# Patient Record
Sex: Female | Born: 1970 | Race: White | Hispanic: No | Marital: Single | Smoking: Never smoker
Health system: Southern US, Community
[De-identification: ages and names within clinical notes are randomized; demographics above are authoritative.]

## PROBLEM LIST (undated history)

## (undated) ENCOUNTER — Ambulatory Visit (HOSPITAL_COMMUNITY): Admission: EM | Payer: BC Managed Care – PPO

## (undated) DIAGNOSIS — I471 Supraventricular tachycardia, unspecified: Secondary | ICD-10-CM

## (undated) DIAGNOSIS — O24419 Gestational diabetes mellitus in pregnancy, unspecified control: Secondary | ICD-10-CM

## (undated) DIAGNOSIS — N200 Calculus of kidney: Secondary | ICD-10-CM

## (undated) DIAGNOSIS — R0602 Shortness of breath: Secondary | ICD-10-CM

## (undated) DIAGNOSIS — G43009 Migraine without aura, not intractable, without status migrainosus: Secondary | ICD-10-CM

## (undated) HISTORY — PX: WISDOM TOOTH EXTRACTION: SHX21

## (undated) HISTORY — DX: Supraventricular tachycardia, unspecified: I47.10

## (undated) HISTORY — DX: Supraventricular tachycardia: I47.1

## (undated) HISTORY — DX: Migraine without aura, not intractable, without status migrainosus: G43.009

---

## 1998-02-24 ENCOUNTER — Other Ambulatory Visit: Admission: RE | Admit: 1998-02-24 | Discharge: 1998-02-24 | Payer: Self-pay | Admitting: Obstetrics and Gynecology

## 1999-03-08 ENCOUNTER — Other Ambulatory Visit: Admission: RE | Admit: 1999-03-08 | Discharge: 1999-03-08 | Payer: Self-pay | Admitting: Obstetrics and Gynecology

## 2000-03-09 ENCOUNTER — Other Ambulatory Visit: Admission: RE | Admit: 2000-03-09 | Discharge: 2000-03-09 | Payer: Self-pay | Admitting: Obstetrics and Gynecology

## 2000-09-06 ENCOUNTER — Encounter (INDEPENDENT_AMBULATORY_CARE_PROVIDER_SITE_OTHER): Payer: Self-pay

## 2000-09-06 ENCOUNTER — Inpatient Hospital Stay (HOSPITAL_COMMUNITY): Admission: AD | Admit: 2000-09-06 | Discharge: 2000-09-06 | Payer: Self-pay | Admitting: Obstetrics and Gynecology

## 2000-09-06 ENCOUNTER — Encounter: Payer: Self-pay | Admitting: Obstetrics and Gynecology

## 2000-11-21 ENCOUNTER — Encounter: Admission: RE | Admit: 2000-11-21 | Discharge: 2000-11-21 | Payer: Self-pay | Admitting: Obstetrics & Gynecology

## 2001-02-11 ENCOUNTER — Encounter (INDEPENDENT_AMBULATORY_CARE_PROVIDER_SITE_OTHER): Payer: Self-pay | Admitting: Specialist

## 2001-02-11 ENCOUNTER — Inpatient Hospital Stay (HOSPITAL_COMMUNITY): Admission: AD | Admit: 2001-02-11 | Discharge: 2001-02-17 | Payer: Self-pay | Admitting: Obstetrics & Gynecology

## 2001-02-18 ENCOUNTER — Encounter: Admission: RE | Admit: 2001-02-18 | Discharge: 2001-03-20 | Payer: Self-pay | Admitting: Obstetrics and Gynecology

## 2001-03-12 ENCOUNTER — Other Ambulatory Visit: Admission: RE | Admit: 2001-03-12 | Discharge: 2001-03-12 | Payer: Self-pay | Admitting: Obstetrics & Gynecology

## 2001-03-21 ENCOUNTER — Encounter: Admission: RE | Admit: 2001-03-21 | Discharge: 2001-04-20 | Payer: Self-pay | Admitting: Obstetrics and Gynecology

## 2001-04-21 ENCOUNTER — Encounter: Admission: RE | Admit: 2001-04-21 | Discharge: 2001-05-21 | Payer: Self-pay | Admitting: Obstetrics and Gynecology

## 2003-01-21 ENCOUNTER — Other Ambulatory Visit: Admission: RE | Admit: 2003-01-21 | Discharge: 2003-01-21 | Payer: Self-pay | Admitting: Obstetrics and Gynecology

## 2004-11-16 ENCOUNTER — Other Ambulatory Visit: Admission: RE | Admit: 2004-11-16 | Discharge: 2004-11-16 | Payer: Self-pay | Admitting: Obstetrics and Gynecology

## 2005-12-29 ENCOUNTER — Other Ambulatory Visit: Admission: RE | Admit: 2005-12-29 | Discharge: 2005-12-29 | Payer: Self-pay | Admitting: Obstetrics and Gynecology

## 2006-03-20 ENCOUNTER — Ambulatory Visit (HOSPITAL_COMMUNITY): Admission: RE | Admit: 2006-03-20 | Discharge: 2006-03-20 | Payer: Self-pay | Admitting: Obstetrics and Gynecology

## 2006-09-13 ENCOUNTER — Emergency Department (HOSPITAL_COMMUNITY): Admission: EM | Admit: 2006-09-13 | Discharge: 2006-09-13 | Payer: Self-pay | Admitting: *Deleted

## 2006-10-17 ENCOUNTER — Ambulatory Visit: Payer: Self-pay | Admitting: Cardiovascular Disease

## 2006-10-28 ENCOUNTER — Ambulatory Visit: Payer: Self-pay | Admitting: Cardiovascular Disease

## 2006-11-06 ENCOUNTER — Ambulatory Visit: Payer: Self-pay | Admitting: Cardiovascular Disease

## 2006-11-06 ENCOUNTER — Encounter: Payer: Self-pay | Admitting: Cardiovascular Disease

## 2006-11-06 ENCOUNTER — Ambulatory Visit: Payer: Self-pay

## 2006-11-06 LAB — CONVERTED CEMR LAB
Cholesterol: 183 mg/dL (ref 0–200)
Free T4: 0.7 ng/dL (ref 0.6–1.6)
HDL: 44.7 mg/dL (ref >39.0)
LDL Cholesterol: 122 mg/dL — ABNORMAL HIGH (ref 0–99)
TSH: 2.51 microintl units/mL (ref 0.35–5.50)
Total CHOL/HDL Ratio: 4.1
Triglycerides: 82 mg/dL (ref 0–149)
VLDL: 16 mg/dL (ref 0–40)

## 2006-11-09 ENCOUNTER — Ambulatory Visit: Payer: Self-pay | Admitting: Cardiovascular Disease

## 2006-12-20 ENCOUNTER — Ambulatory Visit: Payer: Self-pay | Admitting: Internal Medicine

## 2007-02-27 ENCOUNTER — Emergency Department (HOSPITAL_COMMUNITY): Admission: EM | Admit: 2007-02-27 | Discharge: 2007-02-27 | Payer: Self-pay | Admitting: Emergency Medicine

## 2007-03-22 ENCOUNTER — Encounter: Admission: RE | Admit: 2007-03-22 | Discharge: 2007-03-22 | Payer: Self-pay | Admitting: Obstetrics and Gynecology

## 2007-08-01 ENCOUNTER — Other Ambulatory Visit: Payer: Self-pay | Admitting: Obstetrics and Gynecology

## 2007-08-03 ENCOUNTER — Inpatient Hospital Stay (HOSPITAL_COMMUNITY): Admission: AD | Admit: 2007-08-03 | Discharge: 2007-08-07 | Payer: Self-pay | Admitting: Obstetrics and Gynecology

## 2007-08-08 ENCOUNTER — Inpatient Hospital Stay (HOSPITAL_COMMUNITY): Admission: AD | Admit: 2007-08-08 | Discharge: 2007-08-08 | Payer: Self-pay | Admitting: Obstetrics and Gynecology

## 2008-08-07 ENCOUNTER — Emergency Department (HOSPITAL_COMMUNITY): Admission: EM | Admit: 2008-08-07 | Discharge: 2008-08-07 | Payer: Self-pay | Admitting: Emergency Medicine

## 2008-08-07 IMAGING — CT CT PELVIS W/O CM
2 of 6 series · 14 of 36 positions shown, 19 images · IV contrast (agent unspecified)
Comparison: None

CT ABDOMEN:

CLINICAL DATA: Mid to lower abdominal pain, nausea, syncope,
history stones

CT ABDOMEN AND PELVIS WITHOUT CONTRAST:
TECHNIQUE: Multidetector helical CT imaging of the abdomen and
pelvis were performed usuing kidney stone protocol.  Neither oral
nor intravenous contrast utlized for this indication.  Sagittal and
coronal MPR images reconstructed from the axial data set.

[Series 102: >200 lbs stone · axial · 0.78mm/px · z∈[-410,-2]mm · 13 of 409 slices shown, 17 images]
[im 21/409  soft-tissue]
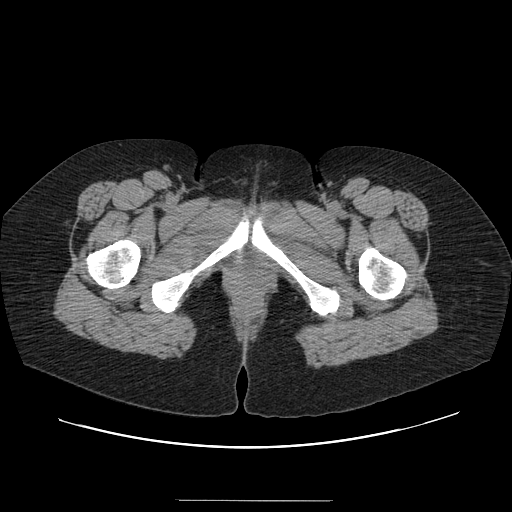
[im 21/409  bone]
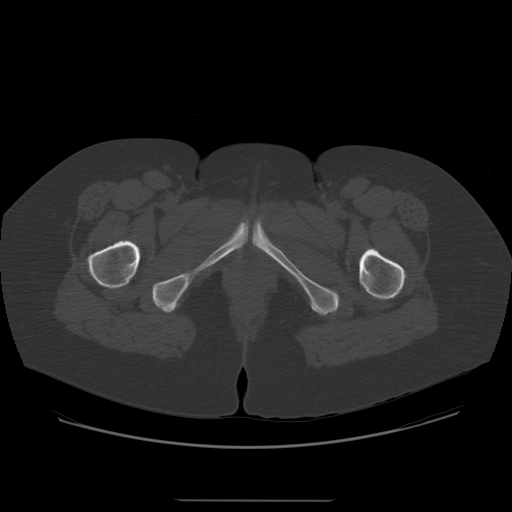
[im 62/409  soft-tissue]
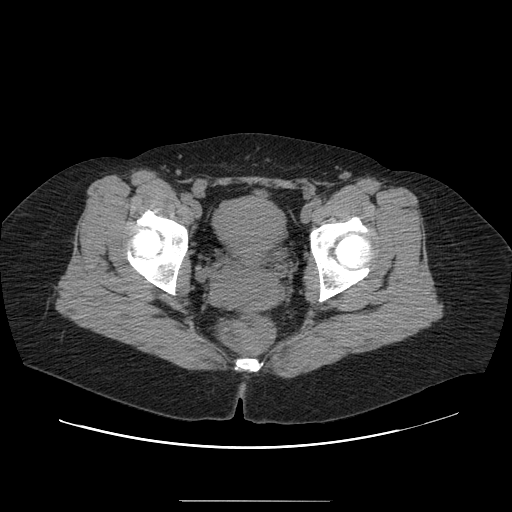
[im 103/409  soft-tissue]
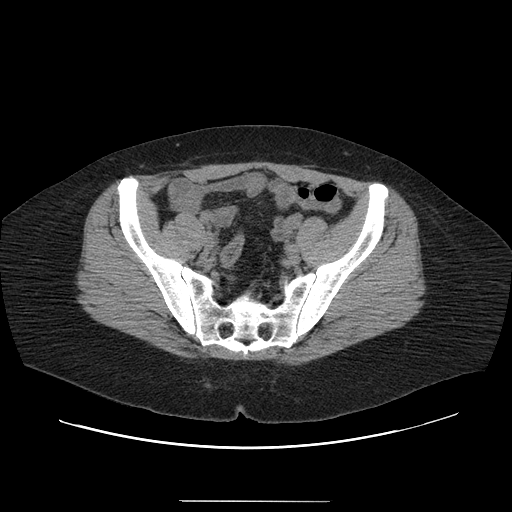
[im 143/409  soft-tissue]
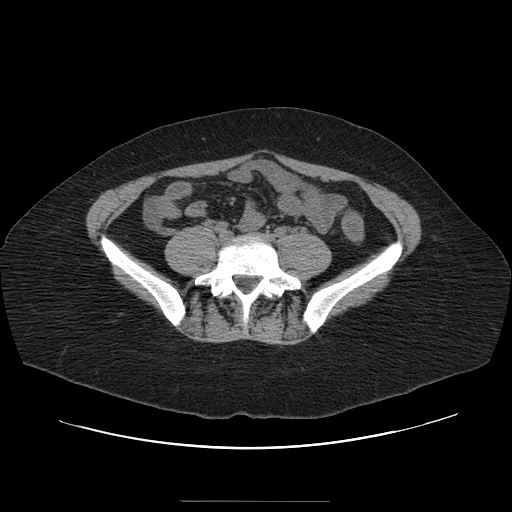
[im 164/409  soft-tissue]
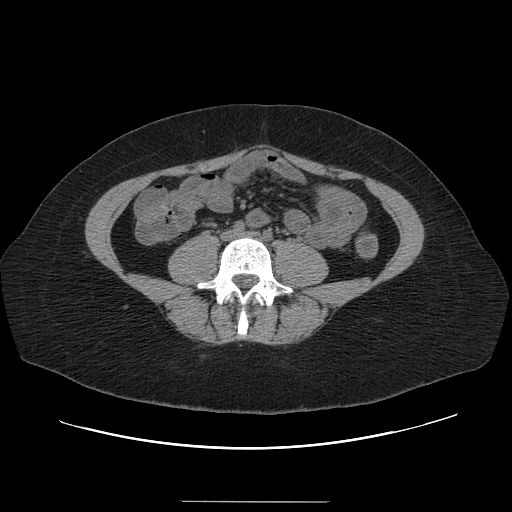
[im 205/409  soft-tissue]
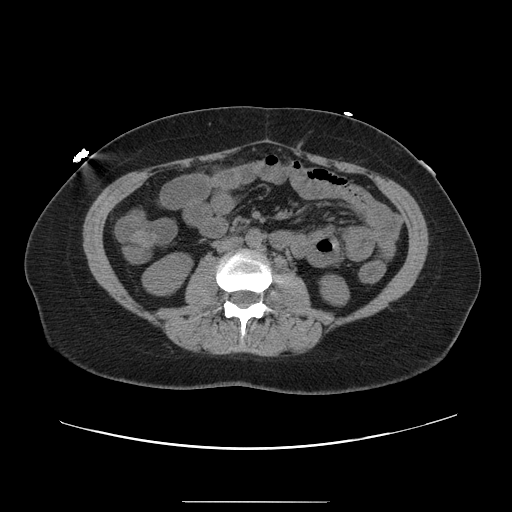
[im 245/409  soft-tissue]
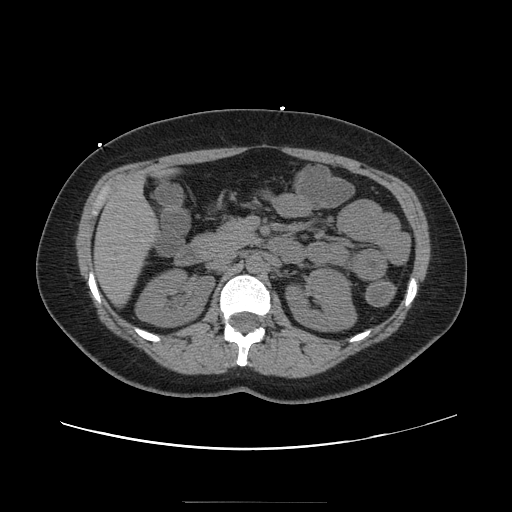
[im 266/409  soft-tissue]
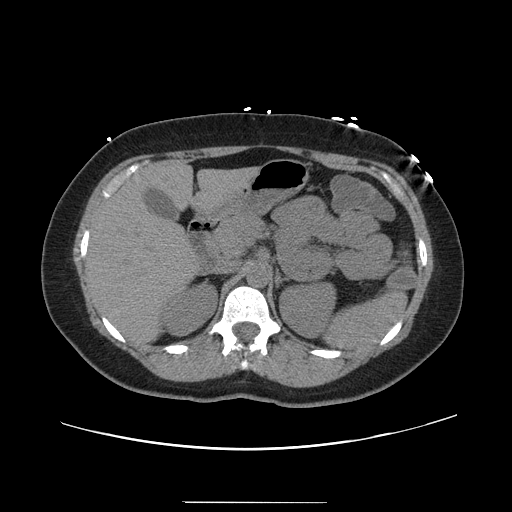
[im 307/409  soft-tissue]
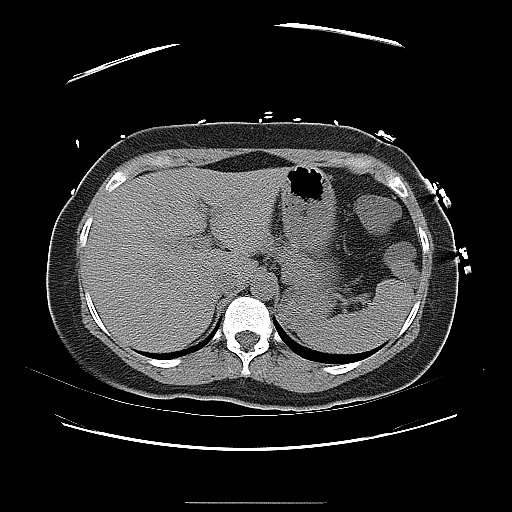
[im 307/409  bone]
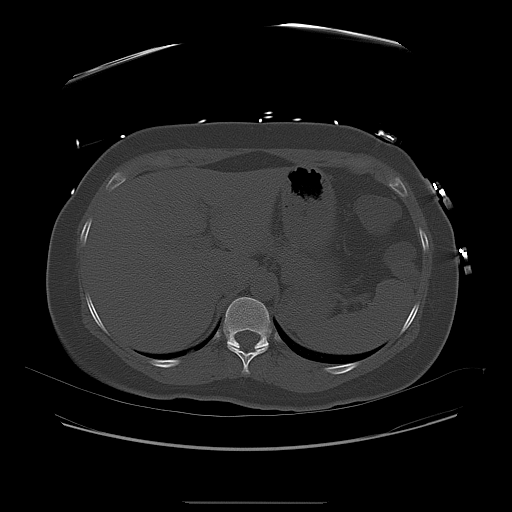
[im 327/409  lung]
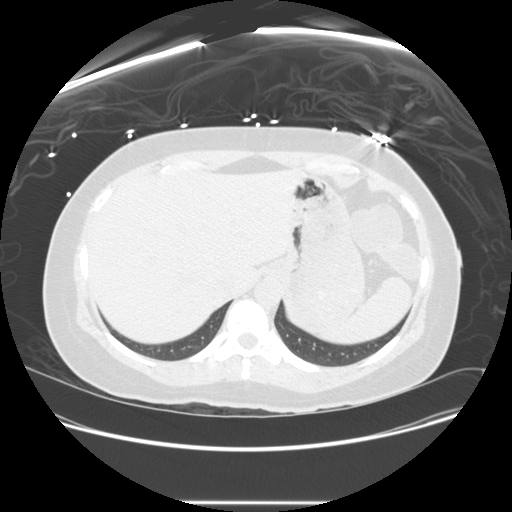
[im 347/409  soft-tissue]
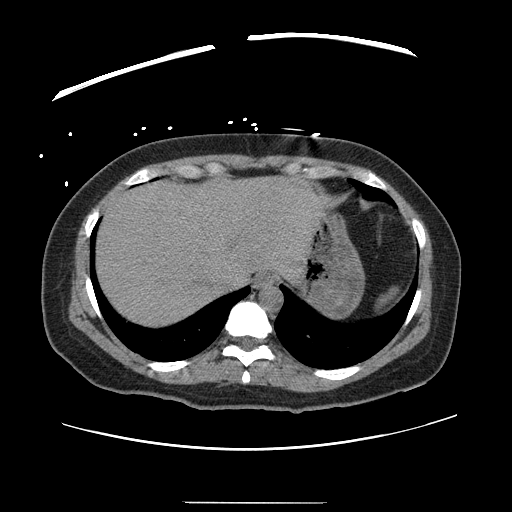
[im 347/409  lung]
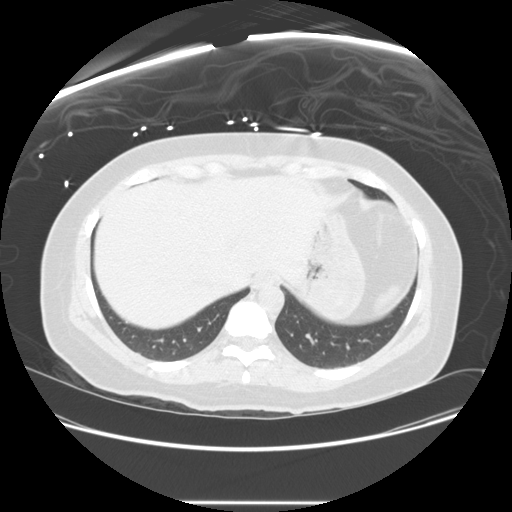
[im 368/409  lung]
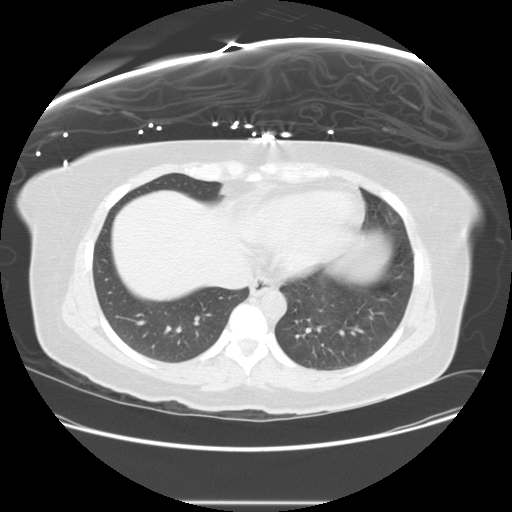
[im 388/409  soft-tissue]
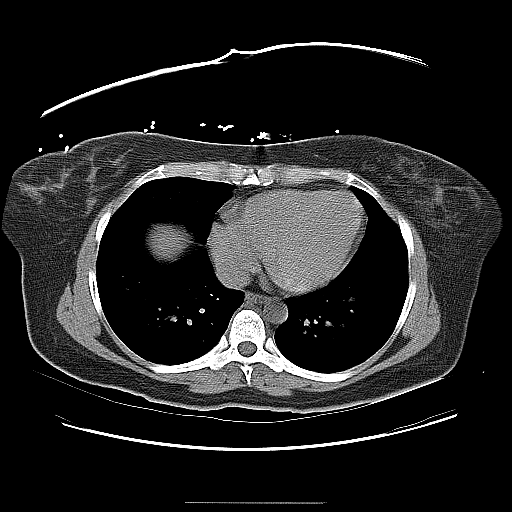
[im 388/409  lung]
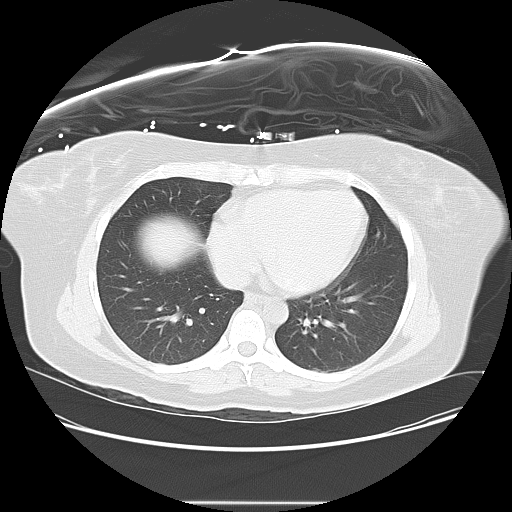

[Series 103: reformatted · sagittal · 0.91mm/px · 1 of 121 slices shown, 2 images]
[im 41/121  soft-tissue]
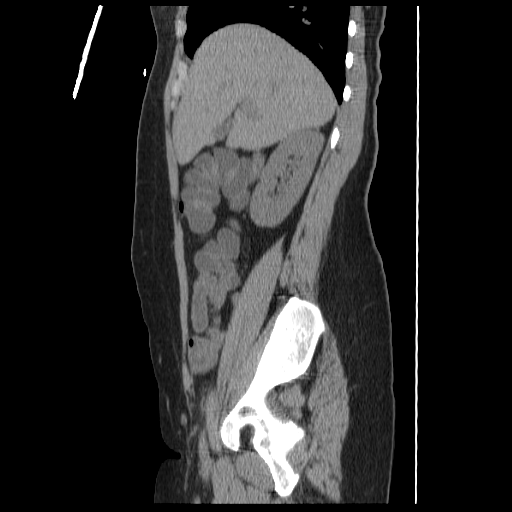
[im 41/121  bone]
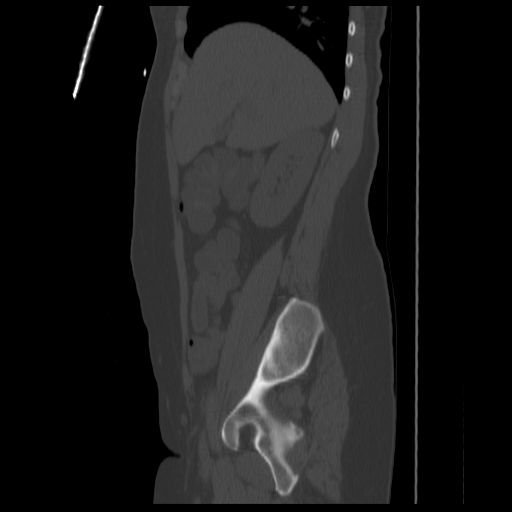

[14 of 36 positions shown; findings below may reference images not displayed]

FINDINGS: Lung bases clear.
Tiny nonobstructing calculus lower pole right kidney, 3 mm diameter
image 33.
AMAMBAL hydronephrosis, ureteral calcification, or ureteral dilatation.
Tiny 102 mm calculus lower pole left kidney 36.
Solid organs and bowel loops in upper abdomen unremarkable for exam
lacking IV and oral contrast.
No mass, adenopathy, or free fluid.
IMPRESSION: Tiny bilateral nonobstructing calculi lower poles of both kidneys.
No acute upper abdominal abnormalities.

CT PELVIS:
FINDINGS: Normal appendix.
Large and small bowel loops in pelvis grossly normal appearance for
exam lacking IV and oral contrast.
Unremarkable uterus and adnexae.
Bones unremarkable.
Single tiny calcification posterior to bladder on right, image 79,
difficult to localize distal right ureter on axial images but
possibility of nonobstructing distal right ureteral calculus
raised.
No ureteral dilatation.
Bladder decompressed.
IMPRESSION: Tiny 1 to 2 mm diameter calcification posterior right bladder base,
difficult to visualize the distal right ureter with possibility of
a nonobstructing distal right ureteral calculus is raised,
recommend correlation with urinalysis.
No other significant intrapelvic abnormalities.

## 2009-04-20 ENCOUNTER — Telehealth (INDEPENDENT_AMBULATORY_CARE_PROVIDER_SITE_OTHER): Payer: Self-pay | Admitting: *Deleted

## 2009-08-01 HISTORY — PX: INGUINAL HERNIA REPAIR: SUR1180

## 2009-10-16 ENCOUNTER — Telehealth: Payer: Self-pay | Admitting: Cardiovascular Disease

## 2009-10-26 ENCOUNTER — Ambulatory Visit (HOSPITAL_BASED_OUTPATIENT_CLINIC_OR_DEPARTMENT_OTHER): Admission: RE | Admit: 2009-10-26 | Discharge: 2009-10-26 | Payer: Self-pay | Admitting: General Surgery

## 2010-09-02 NOTE — Progress Notes (Signed)
Summary: can pt be release   Phone Note Call from Patient Call back at Home Phone [phone] Call back at [phone]   Caller: Patient- teacher leave message Reason for Call: Talk to Nurse Details for Reason: can pt be release from care, pt haven't had no problem. - insurance reason. Initial call taken by: Lorne Skeens,  October 16, 2009 11:29 AM  Follow-up for Phone Call        left message for pt to call with any problems in the future Deliah Goody, RN  October 16, 2009 12:15 PM

## 2010-10-25 LAB — CBC
HCT: 38 % (ref 36.0–46.0)
MCHC: 34.1 g/dL (ref 30.0–36.0)
MCV: 90.7 fL (ref 78.0–100.0)
Platelets: 205 10*3/uL (ref 150–400)
RDW: 13.1 % (ref 11.5–15.5)
WBC: 5.4 10*3/uL (ref 4.0–10.5)

## 2010-10-25 LAB — BASIC METABOLIC PANEL
BUN: 11 mg/dL (ref 6–23)
Chloride: 108 mEq/L (ref 96–112)
Creatinine, Ser: 0.64 mg/dL (ref 0.4–1.2)
Glucose, Bld: 88 mg/dL (ref 70–99)
Potassium: 4.2 mEq/L (ref 3.5–5.1)

## 2010-10-25 LAB — DIFFERENTIAL
Basophils Absolute: 0 10*3/uL (ref 0.0–0.1)
Basophils Relative: 1 % (ref 0–1)
Eosinophils Absolute: 0.1 10*3/uL (ref 0.0–0.7)
Eosinophils Relative: 2 % (ref 0–5)
Lymphs Abs: 2 10*3/uL (ref 0.7–4.0)
Neutrophils Relative %: 54 % (ref 43–77)

## 2010-10-25 LAB — PREGNANCY, URINE: Preg Test, Ur: NEGATIVE

## 2010-11-15 LAB — CBC
Hemoglobin: 13.8 g/dL (ref 12.0–15.0)
MCHC: 33.3 g/dL (ref 30.0–36.0)
RBC: 4.64 MIL/uL (ref 3.87–5.11)
WBC: 17.3 10*3/uL — ABNORMAL HIGH (ref 4.0–10.5)

## 2010-11-15 LAB — BASIC METABOLIC PANEL
CO2: 24 mEq/L (ref 19–32)
Calcium: 9.2 mg/dL (ref 8.4–10.5)
Creatinine, Ser: 0.73 mg/dL (ref 0.4–1.2)
GFR calc Af Amer: >60 mL/min (ref >60)
GFR calc non Af Amer: >60 mL/min (ref >60)
Sodium: 140 mEq/L (ref 135–145)

## 2010-11-15 LAB — DIFFERENTIAL
Basophils Relative: 1 % (ref 0–1)
Lymphocytes Relative: 9 % — ABNORMAL LOW (ref 12–46)
Monocytes Absolute: 0.9 10*3/uL (ref 0.1–1.0)
Monocytes Relative: 5 % (ref 3–12)
Neutro Abs: 14.7 10*3/uL — ABNORMAL HIGH (ref 1.7–7.7)
Neutrophils Relative %: 85 % — ABNORMAL HIGH (ref 43–77)

## 2010-11-15 LAB — URINALYSIS, ROUTINE W REFLEX MICROSCOPIC
Glucose, UA: NEGATIVE mg/dL
Hgb urine dipstick: NEGATIVE
Protein, ur: NEGATIVE mg/dL
Specific Gravity, Urine: 1.026 (ref 1.005–1.030)
Urobilinogen, UA: 0.2 mg/dL (ref 0.0–1.0)

## 2010-11-15 LAB — GLUCOSE, CAPILLARY: Glucose-Capillary: 96 mg/dL (ref 70–99)

## 2010-12-14 NOTE — Discharge Summary (Signed)
Melissa, [name] NO.:  [id number]   MEDICAL RECORD NO.:  [id number]          PATIENT TYPE:  INP   LOCATION:  9109                          FACILITY:  [facility]   PHYSICIAN:  Osborn Coho, M.D.   DATE OF BIRTH:  11/12/1970   DATE OF ADMISSION:  08/03/2007  DATE OF DISCHARGE:  08/07/2007                               DISCHARGE SUMMARY   ADMISSION DIAGNOSES:  1. Intrauterine pregnancy at 39-1/7 weeks.  2. Spontaneous rupture of membranes.  3. Previous cesarean section.  4. Desires repeat cesarean section.  5. Gestational diabetes, diet controlled.  6. Gestational hypertension necessitating medications.  7. Group beta Streptococcus negative.   DISCHARGE DIAGNOSES:  1. Intrauterine pregnancy at term.  2. Breast feeding.  3. Gestational diabetes, diet controlled.  4. Gestational hypertension and normotensive postpartum.  5. Status post cesarean delivery of a viable female infant named [name],      weighing 8 pounds 12 ounces with Apgars 9/9.   PROCEDURES:  1. Spinal anesthesia.  2. Repeat low transverse cesarean section.   HOSPITAL COURSE:  [name] is a 40 year old single white female,  gravida 3, para 1-0-1-1, who presented on the date of admission  at 65-  1/7 weeks with complaint of spontaneous rupture of membranes earlier  that afternoon at approximately 1400 which was August 03, 2007.  In  maternity admissions did have positive pooling and positive fern as well  as clear leakage of fluid.  The patient was desiring a repeat cesarean  section.  She had been scheduled for a repeat cesarean section on  August 06, 2007, at noon.  Dr. Pennie Rushing was notified, and the patient was  prepped for the OR.  Repeat low transverse cesarean section was  performed per Dr. Dierdre Forth.  Findings were a viable female infant  named [name], weighing 8 pounds 12 ounces with Apgars of 9/9.  The patient  tolerated the procedure well and was taken to the recovery room in good  condition.  Placenta went to labor and delivery, and infant went to full-  term nursery.   On postoperative day #1, the patient was doing well.  She was up ad lib  and breast feeding.  Her vital signs were stable, and she was afebrile.  Her fasting blood sugar was 103, and 2 hours after breakfast, the  patient's sugar was equal to 88.  Hemoglobin was equal to 10 on  postoperative day #2.  White blood cell count was 15.1, and platelets  were stable 213.  Her abdomen was soft.  It ws appropriately tender.  Incision dressing had old drainage which was marked.  Fundus was firm.  She had scant lochia.  Extremities were within normal limits.  No  further plans were made to continue checking CBGs.  The patient's blood  pressure was normotensive.   On postoperative day #2, the patient continued to do well.  She was  voiding without difficulty, tolerating a regular diet,was up ad lib.  Her pain was well controlled with Motrin and p.r.n. Percocet.  The  patient did report difficulty with lactation secondary to  engorgement  and was encouraged to pump prior to assisting newborn female to breast.  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  The patient's  vital signs remained stable, and the patient remained afebrile.  Physical exam remained within normal limits.  Breasts were full but  soft, slightly tender.  No redness or blisters.  Fundus remained firm -  2.  She had scant lochia and had negative Homan's sign, and extremities  within normal limits.  The patient worked with Advertising copywriter that  day using SNF as well as syringe feed and continued to pump, massage,  with warm compresses while pumping to help with engorgement.  She  remained normotensive.   Remainder of patient's postpartum course remained uncomplicated.  She  was ready to  go home on postoperative day #3.  Breast feeding was going  much better, and patient was utilizing breast shields to assist newborn  female with that.  She continued frequent feedings pumping.  Newborn female  was to follow up with pediatrician on Thursday, January 8, and to  further discuss appointment with urologist secondary to tethered penis.  The patient's pain was well controlled with Motrin and Percocet.  Lochia  remained scant, and fundus remained firm.  Incision was appropriately  tender.  Steri-Strips were intact with old dark brown-red drainage.  Extremities remained within normal limits.  The patient was deemed to  have received full benefit of her hospital stay and was discharged home  in good condition with normotensive blood pressure.   DISCHARGE MEDICATIONS:  1. Motrin 600 mg p.o. q. 6 h p.r.n. pain.  2. Tylox 1-2 tablets p.o. q. 4-6 h p.r.n. moderate to severe pain.  3. Continue prenatal vitamins 1 tablet p.o. daily.   Discharge instructions were per [facility] handout.  Discharge followup will occur in 6 weeks or as needed.   CONDITION ON DISCHARGE:  Good.      Candice [place], [place]      Osborn Coho, M.D.  Electronically Signed    CHS/MEDQ  D:  08/07/2007  T:  08/07/2007  Job:  [id number]

## 2010-12-14 NOTE — Op Note (Signed)
[name], [name]               ACCOUNT NO.:  [id number]   MEDICAL RECORD NO.:  [id number]          PATIENT TYPE:  INP   LOCATION:  9109                          [facility]ILITY:  [facility]   PHYSICIAN:  Hal Morales, M.D.DATE OF BIRTH:  27-Oct-1970   DATE OF PROCEDURE:  08/04/2007  DATE OF DISCHARGE:                               OPERATIVE REPORT   PREOPERATIVE DIAGNOSES:  Intrauterine pregnancy at 39 weeks and 2 days,  prior cesarean section, desire for repeat cesarean section, spontaneous  rupture of membranes.   POSTOPERATIVE DIAGNOSES:  Intrauterine pregnancy at 39 weeks and 2 days,  prior cesarean section, desire for repeat cesarean section, spontaneous  rupture of membranes.   OPERATION:  Repeat low transverse cesarean section.   SURGEON:  Hal Morales, M.D.   FIRST ASSISTANT:  Alonna Minium Certified Nurse Midwife.   ANESTHESIA:  Spinal.   ESTIMATED BLOOD LOSS:  Less than 750 mL.   COMPLICATIONS:  None.   FINDINGS:  The patient was delivered of a female infant whose name is  [name], weighing 8 pounds 12 ounces with Apgars of 9 and 9 at one and five  minutes respectively.  The placenta contained an eccentrically inserted  three-vessel cord.  The uterus, tubes and ovaries were normal for the  gravid state.   PROCEDURE:  The patient was taken to the operating room, after  appropriate identification, and placed on the operating table.  After  the placement of a spinal anesthetic, she was placed in the supine  position with a left lateral tilt.  The abdomen was prepped with  multiple layers of Betadine, then the perineum prepped and the Foley  catheter inserted under sterile conditions and connected to straight  drainage.  The abdomen was draped as a sterile field.  After assurance  of adequate surgical anesthesia, the site of the previous cesarean  section incision was infiltrated with 20 mL of 0.25% Marcaine.   The abdomen was opened in layers through a low transverse  incision at  the site of the previous incision.  The peritoneum was entered and the  bladder blade placed.  The uterus was incised approximately 2 cm above  the uterovesical fold and that incision taken laterally on either side  bluntly.  The infant was delivered from the occiput transverse position  with the aid of a Kiwi vacuum extractor, and after having the nares and  pharynx suctioned and cord clamped and cut, was handed off to the  awaiting pediatricians.  The appropriate cord blood was drawn and  placenta allowed to separate from the uterus.  It was removed from the  operative field.  The uterine incision was closed with running  interlocking suture of #0 Vicryl, an imbricating suture of #0 Vicryl was  then placed.  Several hemostatic sutures of #0 Vicryl were placed for  adequate hemostasis.  Copious irrigation was carried out.  The abdominal  peritoneum was closed with a running suture of 2-0 Vicryl.  The rectus  muscles were reapproximated in the midline with figure-of-eight suture  of 2-0 Vicryl.  The rectus fascia was closed with  a running suture of #0  Vicryl then reinforced on either side of midline with figure-of-eight  sutures of #0 Vicryl.  The subcutaneous tissue was made hemostatic with  Bovie cautery.  Subcutaneous sutures of 2-0 Vicryl were placed to  reapproximate the subcutaneous tissue.  The skin incision was closed  with a subcuticular suture of 3-0 Monocryl.  Steri-Strips were applied.  A sterile dressing was applied.   The patient was taken from the operating room to the recovery room in  satisfactory condition, having tolerated the procedure well, with sponge  and instrument counts correct.  The placenta went to labor and delivery.  The infant went to the full-term nursery.      Hal Morales, M.D.  Electronically Signed     VPH/MEDQ  D:  08/04/2007  T:  08/04/2007  Job:  [id number]

## 2010-12-14 NOTE — Letter (Signed)
Dec 20, 2006    Noralyn Pick. Eden Emms, MD, [facility]  [address]  [address]  [place], [place] [address]   RE:  [name], [name]  MRN:  [id number]  /  DOB:  [birth year]   Dear Theron Arista:   It was a pleasure to see your patient [name] today in  consultation regarding supraventricular tachycardia.   As you know, she is a 40 year old divorced mother of one who works as a  Editor, commissioning, who has a history of two episodes of recurrent abrupt  onset offset tachy palpitations. They were associated with  lightheadedness, chest pain and profound diuresis. They were not frog  positive.  On the first occasion after it persisted for an hour or two,  EMS was called and arrived, found her heart rate to be about 200 and she  was given adenosine which promptly terminated the tachycardia.  On the  second occasion she waited and it stopped by itself after about an hour.   Her cardiac evaluation has included a normal stress-echo.   PAST MEDICAL HISTORY:  Is notable for gestational diabetes.  She has had  a prior C-section.   REVIEW OF SYSTEMS:  Is broadly negative.   FAMILY HISTORY:  Noncontributory.   CURRENT MEDICATIONS:  Include prenatal vitamins.   SHE HAS NO KNOWN DRUG ALLERGIES.   SOCIAL HISTORY:  She is a Editor, commissioning.  She comes in today with her  female partner.  They are pregnant.   ON EXAMINATION:  She is a young Caucasian female appearing her stated  age of 38.  Her weight was 166, blood pressure is 112/60 and her pulse  was 99.   HEENT EXAM:  Demonstrated no icterus or xanthoma. Neck veins were flat.  Carotid were brisk and full bilaterally without bruits.   BACK:  Without kyphosis, scoliosis.   LUNGS:  Clear.   HEART SOUNDS:  Regular without murmurs or gallops.   ABDOMEN:  Soft and a little bit protuberant, and was not vigorously  palpated.  Femoral pulses were 2+.   EXTREMITIES:  Distal pulses were intact and there is no clubbing,  cyanosis or edema.   NEUROLOGICAL EXAM:   Grossly normal.   SKIN:  Was warm and dry.   Electrocardiogram dated today demonstrated sinus rhythm at 99 with  intervals of 0.13/0.08/0.33, axis was mildly leftward at -12. There was  no evidence of ventricular preexcitation.   IMPRESSION:  1. 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  2. Pregnancy.   Pete, [name] has tachycardia that is probably AV nodal reentry based  on the diuresis symptoms that are associated with it. She is also  pregnant.  Because of that, the p.r.n. beta blockers which were  recommended she has discontinued.   She was advised by her obstetrician not to take any beta blockers  particularly in the first trimester.   I have reviewed with her and her partner the fact that tachycardia  frequently increases in frequency during pregnancy.  Adenosine is felt  to be safe and in the event that she has incessant tachycardia, a class  1A agent or 1C agent have both been used to control symptoms. Obviously  a fluoroscopic procedure would be only a last resort.   Will plan to see [name] in about a year. I have advised her to call  us if there are any problems in the interim.  If I can be of any further  assistance please do not hesitate to contact  me.    Sincerely,      Duke Salvia, MD, [facility]  Electronically Signed    SCK/MedQ  DD: 12/20/2006  DT: 12/20/2006  Job #: [id number]   CC:    Harrel Lemon. Merla Riches, M.D.
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
## 2010-12-17 NOTE — Op Note (Signed)
[facility]  Patient:    [name], [name]                      MRN: [id number] Proc. Date: 02/12/01 Adm. Date:  [id number] Attending:  Lars Pinks                           Operative Report  PREOPERATIVE DIAGNOSIS:       Second-stage arrest.  POSTOPERATIVE DIAGNOSIS:      Second-stage arrest, with persistent OP presentation.  PROCEDURE:                    Primary low transverse cesarean section.  SURGEON:                      Richard D. Arlyce Dice, M.D.  ANESTHESIA:                   Epidural.  ESTIMATED BLOOD LOSS:         800 cc.  FINDINGS:                     Female infant.  Apgar scores 3 at 1 min, 8 at 5 min and 9 at 10 min.  Birth weight 8 pounds 5 ounces.  Arterial cord pH 7.26.  Meconium-stained fluid.  Normal appearing tubes and ovaries; normal uterus.  INDICATIONS:                  This is a 40 year old gravida 2, para 0 who presented in early labor.  Prenatal course was complicated by gestational diabetes.  An ultrasound estimated fetal weight on February 08, 2001 was 8 pounds 0 ounces.   The patient was augmented with Pitocin after rupture of membranes revealed clear fluid, and she progressed very slowly throughout the day.  She finally hit active labor at approximately 2200 h on July 14, and progressed fairly normally at that point.  However, after reaching complete dilatation she pushed for 2-1/2 hr with no further descent of the vertex, which was then 0 to +1 station.  Decision was made, because of the station and the fact that the vertex was not in the anterior-posterior plane, that cesarean section would be the safest approach for delivery.  PROCEDURE:                    The patient was taken to the operating room, where the epidural anesthesia that had been in place during labor was injected for surgical anesthesia.  The abdomen was prepped and draped in sterile fashion.  A low transverse incision was made and carried down to  the fascia, which was then incised and extended transversely.  The rectus muscles were then dissected free from the overlying rectus sheath.  The rectus muscles were divided midline, the peritoneum was entered sharply and extended by sharp and blunt dissection.  The lower segment was identified.  A 7 mm incision was made in the serosa, and the bladder was then dissected free inferiorly.  The lower segment then entered sharply and extended bluntly.  At this point moderate meconium staining was noted, where none had been noted previously.  The head was delivered without difficulty from the occipitoposterior position.  The nasal and oropharynx were ______ suctioned and bulb suctioned.  The infant was given to the pediatricians, who then did endotracheal toilet.  There were no  meconium sighted below the cords.  Arterial cord blood was obtained, which is 7.26.  The venous bloods were obtained and the placenta was then delivered.  The uterus was brought into curettage.  The lower segment was closed in a single layer of running, interlocking Vicryl 1 suture.  The bladder plane was not reopposed.  The peritoneum and rectus muscle were closed in the midline. The fascia was closed with running 0 Vicryl suture.  The skin was closed with staples.  The patient tolerated the procedure well and left the operating room in stable condition. DD:  02/12/01 TD:  02/12/01 Job: [id number]

## 2010-12-17 NOTE — Discharge Summary (Signed)
[facility]  Patient:    [name], [name]                      MRN: [id number] Adm. Date:  [id number] Disc. Date: [id number] Attending:  Lars Pinks Dictator:   Leilani Able, P.A.                           Discharge Summary  FINAL DIAGNOSES:              Intrauterine pregnancy at term, diet controlled gestational diabetes mellitus, second stage arrest of labor.  PROCEDURE:                    Primary low transverse cesarean section.  SURGEON:                      Dr. Ilda Mori  COMPLICATIONS:                None.  HISTORY:                      This 40 year old G2, P0 presents in active labor at term.  Patients prenatal course was complicated by gestational diabetes which was diet controlled.  Patient was started on Pitocin to help augment her labor and rupture of membranes revealed clear fluid.  Patient progressed very slowly throughout the day and finally hit active labor by that evening.  She progressed normally at this point and reached complete complete.  She pushed for over two and a half hours with no descent of the vertex past the 0 station and at this point a decision was made to proceed with a cesarean section and the vertex was also not in the anterior posterior plane.  Therefore, at this point the patient was taken to the operating room by Dr. Ilda Mori where a primary low transverse cesarean section was performed with delivery of an 8 pound 5 ounce female infant with Apgars of 3, 8, and 9.  The delivery went without complications.  Patients postoperative course was complicated by some elevated blood pressures postoperatively and some headache.  The baby was in the NICU with some seizure activity.  On postoperative day #3 secondary to the patients elevated blood pressures and slightly elevated liver function tests, she was sent to the AICU for magnesium sulfate for seizure prophylaxis and also started on labetalol for  her elevated blood pressures.  Postoperative day #4 the patient was feeling much better.  She was not having any headaches. Her blood pressures were in the normal range.  Magnesium sulfate was stopped on postoperative day #5 and she was felt ready for discharge.  She was sent home on a regular diet, told to decrease activities, told to continue prenatal vitamins.  Was to follow-up in the office in four to six weeks. DD:  03/07/01 TD:  03/07/01 Job: [id number]

## 2010-12-17 NOTE — Assessment & Plan Note (Signed)
[name]                            CARDIOLOGY OFFICE NOTE   NAME:GRUBBSNavea, Woodrow                      MRN:          [id number]  DATE:10/17/2006                            DOB:          20-Nov-1970    Mrs.  [name] is a delightful 40 year old patient seen by Dr. Merla Riches.  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  The first one occurred February 13.  She felt presyncopal.  She was  given adenosine in the field by EMS and broke.   She had a followup episode a couple of weeks ago that lasted about 2  hours and finally slowed down.  She does not have a history of other  structural heart disease.  The two episodes were on February 13 and  March 6.   The patient has no history of rheumatic fever.  There is no history of  congenital heart disease.  She has not had any syncope.  These are the  first two episodes of PSVT or palpitations that she has ever had.   She denies chest pain, exertional dyspnea, although she is not very  active.   She has no history of allergies.  She does smoke.  She does not abuse  drugs.  She drinks alcohol once a year.   She is fairly sedentary.  She is a Chartered loss adjuster.  She has been  previously married and has a special needs child.  He had a significant  brain damage during birth and is seen at [facility].  She is currently  living with a female partner who also teaches.  They seem to have an  excellent relationship.  She has been on Clomid intermittently recently  and is trying to have  another baby.  She is currently not pregnant.   She is not sure if there has been a relationship between the periods of  Clomid use and PSVT.   She has had gestational diabetes in the past.   FAMILY HISTORY:  Remarkable for mother and father being alive and well.  She is currently taking no medications.   She has not had any previous surgeries besides a C section in July 2002.  This was after 27 hours of  labor and, unfortunately, resulted in the  neuropathic injury to her son, [name].   PHYSICAL EXAMINATION:  VITAL SIGNS:  Blood pressure 120/70, pulse 70 and  regular.  HEENT: Normal.  NECK: Carotids normal without bruits.  Thyroid is not palpable.  LUNGS:  Clear.  CARDIAC:  There is an S1 and S2, normal heart sounds.  ABDOMEN:  Benign.  EXTREMITIES:  Lower extremities have intact pulses, no edema.   EKG at baseline is normal.   IMPRESSION:  The patient has paroxysmal supraventricular tachycardia.  I  do not have any documentation of this, but by clinical history, this is  what she has.  She was given a prescription for Inderal to have on a  p.r.n. basis.  We will set her up for a stress echocardiogram to rule  out structural heart disease.  She will be  given a CardioNet monitor.   If she continues to have clusters of episodes, we will set her up to see  EP for question of AV node ablation.   The patient needless to say does not want to pursue this after having  only 2 episodes.   We will give her a CardioNet monitor to see this.  I told her that the  second episode where the heart rate gradually decreased to normal did  not sound like PSVT or the initial episode which broke with adenosine.  In either case, I suspect these are benign atrial arrhythmias.   I will see her back after her testing, and she has Inderal to take  p.r.n.  She was already aware of the vagal maneuvers to help try to stop  the PSVT.   Further recommendations will be based on the results of her stress  echocardiogram and monitoring.     Noralyn Pick. Eden Emms, MD, [facility]  Electronically Signed    PCN/MedQ  DD: 10/17/2006  DT: 10/18/2006  Job #: [id number]   cc:   Harrel Lemon. Merla Riches, M.D.

## 2010-12-17 NOTE — Assessment & Plan Note (Signed)
[facility]                            CARDIOLOGY OFFICE NOTE   NAME:GRUBBSHarlie, [name]                      MRN:          [id number]  DATE:11/09/2006                            DOB:          11-Aug-1970    [name] returns today for followup.  She has had clinical PSVT.   She has an appointment with Dr. Graciela Husbands to discuss possible future  ablation.  Her and her partner are very inquisitive and I think the  information will be useful.  However, I do not think she will have  enough episodes to want to proceed with this.  Since I last saw her she  has not had any recurrence, as she has not had to take her Inderal, her  stress echo was normal with no evidence of structural heart disease and  a CardioNet monitor has revealed only normal sinus rhythm with no  arrhythmias.   Reviews in 10 point review of systems is negative.   In particular, she is not having any significant palpitations, PND,  orthopnea or syncope.   She is only taking vitamins and p.r.n. Inderal.   The patient's LDL cholesterol was 122.  I told her it would be fine to  take fish oil and that she should work on her diet some.   Her exam is remarkable for a blood pressure 120/70, pulse 80 and  regular.  HEENT:  Is normal.  There is no thyromegaly.  No carotid bruits.  LUNGS:  Are clear.  There is an S1, S2 with normal heart sounds.  ABDOMEN:  Benign.  LOWER EXTREMITIES:  Intact pulse, no edema.  SKIN:  Is warm and dry.  NEURO:  Is nonfocal.   IMPRESSION:  Two episodes of PSVT, followup with EP to discuss AV nodal  ablation, possibly if there are recurrences.  I suspect she will do  fine.  Her thyroid tests were normal.  Her stress echo was normal with  no evidence of structure heart disease and her baseline EKG is normal.  We will have her turn in her CardioNet monitor.  She has her Inderal in  case she has another episode.  I will see her back in 6 months.     Noralyn Pick. Eden Emms, MD,  [facility]  Electronically Signed    PCN/MedQ  DD: 11/09/2006  DT: 11/09/2006  Job #: [id number]

## 2011-04-20 LAB — CBC
HCT: 29 — ABNORMAL LOW
HCT: 29.2 — ABNORMAL LOW
Hemoglobin: 10 — ABNORMAL LOW
Hemoglobin: 10.1 — ABNORMAL LOW
MCHC: 34.2
MCHC: 34.3
MCHC: 34.8
MCV: 88.2
MCV: 89.4
MCV: 89.8
Platelets: 213
Platelets: 224
RBC: 3.29 — ABNORMAL LOW
RDW: 14.7

## 2011-04-20 LAB — COMPREHENSIVE METABOLIC PANEL
ALT: 18
BUN: 13
CO2: 21
Calcium: 8.4
GFR calc non Af Amer: >60
Glucose, Bld: 89
Sodium: 140

## 2011-04-20 LAB — RPR: RPR Ser Ql: NONREACTIVE

## 2011-04-20 LAB — URINALYSIS, ROUTINE W REFLEX MICROSCOPIC
Bilirubin Urine: NEGATIVE
Ketones, ur: NEGATIVE
Protein, ur: NEGATIVE
Urobilinogen, UA: 0.2

## 2011-04-20 LAB — URINE MICROSCOPIC-ADD ON: WBC, UA: NONE SEEN

## 2011-04-20 LAB — LACTATE DEHYDROGENASE: LDH: 205

## 2011-04-20 LAB — TYPE AND SCREEN: ABO/RH(D): A POS

## 2011-05-06 LAB — BASIC METABOLIC PANEL
BUN: 3 — ABNORMAL LOW
Calcium: 9.6
GFR calc non Af Amer: >60
Glucose, Bld: 105 — ABNORMAL HIGH
Sodium: 138

## 2011-05-06 LAB — CBC
Platelets: 228
RDW: 14.5

## 2012-01-30 ENCOUNTER — Encounter: Payer: Self-pay | Admitting: Obstetrics and Gynecology

## 2012-01-30 ENCOUNTER — Ambulatory Visit (INDEPENDENT_AMBULATORY_CARE_PROVIDER_SITE_OTHER): Payer: BC Managed Care – PPO | Admitting: Obstetrics and Gynecology

## 2012-01-30 VITALS — BP 112/68 | Ht 62.0 in | Wt 147.0 lb

## 2012-01-30 DIAGNOSIS — Z1231 Encounter for screening mammogram for malignant neoplasm of breast: Secondary | ICD-10-CM

## 2012-01-30 DIAGNOSIS — Z124 Encounter for screening for malignant neoplasm of cervix: Secondary | ICD-10-CM

## 2012-01-30 NOTE — Progress Notes (Signed)
Last Pap: 03/11/2009 WNL: Yes Regular Periods:yes Contraception: none  Monthly Breast exam:yes Tetanus<52yrs:yes Nl.Bladder Function:yes Daily BMs:yes Healthy Diet:no Calcium:yes Mammogram:yes Date of Mammogram: 03/20/2006 Exercise:yes Have often Exercise: twice weekly  Seatbelt: yes Abuse at home: no Stressful work:no Sigmoid-colonoscopy: n/a Bone Density: No PCP: Thereasa Parkin  Change in PMH: None Change in [facility]: None Subjective:    [name] is a 41 y.o. female, [id number], who presents for an annual exam. She and her partner, [name], will not pursue another pregnancy.  They are enjoying their sons.   History   Social History  . Marital Status: Single    Spouse Name: N/A    Number of Children: N/A  . Years of Education: N/A   Social History Main Topics  . Smoking status: Never Smoker   . Smokeless tobacco: Never Used  . Alcohol Use: No  . Drug Use: No  . Sexually Active: Yes    Birth Control/ Protection: None   Other Topics Concern  . None   Social History Narrative  . None    Menstrual cycle:   LMP: Patient's last menstrual period was 01/15/2012.           Cycle: "Like clockwork"  The following portions of the patient's history were reviewed and updated as appropriate: allergies, current medications, past family history, past medical history, past social history, past surgical history and problem list.  Review of Systems Pertinent items are noted in HPI. Breast:Negative for breast lump,nipple discharge or nipple retraction Gastrointestinal: Negative for abdominal pain, change in bowel habits or rectal bleeding Urinary:negative   Objective:    BP 112/68  Ht 5\' 2"  (1.575 m)  Wt 147 lb (66.679 kg)  BMI 26.89 kg/m2  LMP 01/15/2012    Weight:  Wt Readings from Last 1 Encounters:  01/30/12 147 lb (66.679 kg)          BMI: Body mass index is 26.89 kg/(m^2).  General Appearance: Alert, appropriate appearance for age. No acute distress HEENT:  Grossly normal Neck / Thyroid: Supple, no masses, nodes or enlargement Lungs: clear to auscultation bilaterally Back: No CVA tenderness Breast Exam: No masses or nodes.No dimpling, nipple retraction or discharge. Cardiovascular: Regular rate and rhythm. S1, S2, no murmur Gastrointestinal: Soft, non-tender, no masses or organomegaly Pelvic Exam: Vulva and vagina appear normal. Bimanual exam reveals normal uterus and adnexa. Rectovaginal: normal rectal, no masses Lymphatic Exam: Non-palpable nodes in neck, clavicular, axillary, or inguinal regions Skin: no rash or abnormalities Neurologic: Normal gait and speech, no tremor  Psychiatric: Alert and oriented, appropriate affect.   Wet Prep:not applicable Urinalysis:not applicable UPT: Not done   Assessment:    Normal gyn exam    Plan:    mammogram pap smear return annually or prn STD screening: declined Contraception:n/a      Ahlani Wickes PMD

## 2012-01-31 LAB — PAP IG AND HPV HIGH-RISK

## 2012-02-21 ENCOUNTER — Ambulatory Visit
Admission: RE | Admit: 2012-02-21 | Discharge: 2012-02-21 | Disposition: A | Discharge status: Home / Self Care | Payer: BC Managed Care – PPO | Source: Ambulatory Visit | Attending: Obstetrics and Gynecology | Admitting: Obstetrics and Gynecology

## 2012-02-21 DIAGNOSIS — Z1231 Encounter for screening mammogram for malignant neoplasm of breast: Secondary | ICD-10-CM

## 2012-02-23 ENCOUNTER — Encounter: Payer: Self-pay | Admitting: Obstetrics and Gynecology

## 2012-06-19 ENCOUNTER — Ambulatory Visit (INDEPENDENT_AMBULATORY_CARE_PROVIDER_SITE_OTHER): Payer: BC Managed Care – PPO | Admitting: Cardiovascular Disease

## 2012-06-19 ENCOUNTER — Ambulatory Visit (INDEPENDENT_AMBULATORY_CARE_PROVIDER_SITE_OTHER): Payer: BC Managed Care – PPO | Admitting: Internal Medicine

## 2012-06-19 ENCOUNTER — Institutional Professional Consult (permissible substitution): Payer: BC Managed Care – PPO | Admitting: Internal Medicine

## 2012-06-19 ENCOUNTER — Encounter: Payer: Self-pay | Admitting: *Deleted

## 2012-06-19 ENCOUNTER — Encounter: Payer: Self-pay | Admitting: Internal Medicine

## 2012-06-19 VITALS — BP 130/70 | HR 89 | Resp 16 | Ht 62.0 in | Wt 151.8 lb

## 2012-06-19 VITALS — BP 130/70 | HR 89 | Wt 151.0 lb

## 2012-06-19 DIAGNOSIS — Z8632 Personal history of gestational diabetes: Secondary | ICD-10-CM | POA: Insufficient documentation

## 2012-06-19 DIAGNOSIS — I471 Supraventricular tachycardia, unspecified: Secondary | ICD-10-CM | POA: Insufficient documentation

## 2012-06-19 DIAGNOSIS — K469 Unspecified abdominal hernia without obstruction or gangrene: Secondary | ICD-10-CM | POA: Insufficient documentation

## 2012-06-19 NOTE — Progress Notes (Signed)
ELECTROPHYSIOLOGY CONSULT NOTE  Patient ID: [name], MRN: [id number], DOB/AGE: Jan 25, 1971 41 y.o. Admit date: (Not on file) Date of Consult: 06/19/2012  Primary Physician: Tonye Pearson, MD Primary Cardiologist: PN Chief Complaint: svyt   HPI [name] is a 40 y.o. female with a history of SVT dating back at least 5 or 6 years. I apparently saw her in 2008 At that time episodes were occurring semiannually with modest symptoms. Unfortunately, they have increased in frequency and now it had 3 episodes in the last couple of months many of which have lasted for hours and has not really stopped with Valsalva. The associated with significant fatigue, shortness of breath is not clear to me but they seem to be fraught negative. There of are precipitated by bending over. Apart from this she has noted no problems with exercise tolerance. She notes no specific triggers. Echocardiogram 2000 it was normal       Past Medical History  Diagnosis Date  . Hx of gestational diabetes mellitus, not currently pregnant   . Hernia 2011  . PSVT (paroxysmal supraventricular tachycardia)       Surgical History:  Past Surgical History  Procedure Date  . Cesarean section   . Wisdom tooth extraction      Home Meds: Prior to Admission medications   Medication Sig Start Date End Date Taking? Authorizing Provider  calcium carbonate 200 MG capsule Take 250 mg by mouth 2 (two) times daily with a meal.   Yes Historical Provider, MD  ibuprofen (ADVIL,MOTRIN) 400 MG tablet Take 400 mg by mouth every 6 (six) hours as needed.   Yes Historical Provider, MD  Multiple Vitamin (MULTIVITAMIN) capsule Take 1 capsule by mouth daily.   Yes Historical Provider, MD      Allergies: No Known Allergies  History   Social History  . Marital Status: Single    Spouse Name: N/A    Number of Children: N/A  . Years of Education: N/A   Occupational History  . Not on file.   Social History Main  Topics  . Smoking status: Never Smoker   . Smokeless tobacco: Never Used  . Alcohol Use: No  . Drug Use: No  . Sexually Active: Yes    Birth Control/ Protection: None   Other Topics Concern  . Not on file   Social History Narrative  . No narrative on file   2 children 5/11  Family History  Problem Relation Age of Onset  . Heart disease Father   . Hypertension Father   . Diabetes Father   . Emphysema Sister   . Ulcerative colitis Sister   . Heart disease Maternal Grandfather   . Heart disease Paternal Grandfather   . Emphysema Paternal Grandfather      ROS:  Please see the history of present illness.   All other systems reviewed and negative.    Physical Exam: A Blood pressure 130/70, pulse 89, resp. rate 16, height 5\' 2"  (1.575 m), weight 151 lb 12.8 oz (68.856 kg). General: Well developed, well nourished female in no acute distress. Head: Normocephalic, atraumatic, sclera non-icteric, no xanthomas, nares are without discharge. Lymph Nodes:  none Back: without scoliosis/kyphosis, no CVA tendersness Neck: Negative for carotid bruits. JVD not elevated. Lungs: Clear bilaterally to auscultation without wheezes, rales, or rhonchi. Breathing is unlabored. Heart: RRR with S1 S2.  2/6  murmur , rubs, or gallops appreciated. Abdomen: Soft, non-tender, non-distended with normoactive bowel sounds. No hepatomegaly. No rebound/guarding. No  obvious abdominal masses. Msk:  Strength and tone appear normal for age. Extremities: No clubbing or cyanosis. No edema.  Distal pedal pulses are 2+ and equal bilaterally. Skin: Warm and Dry Neuro: Alert and oriented X 3. CN III-XII intact Grossly normal sensory and motor function . Psych:  Responds to questions appropriately with a normal affect.      Labs: Cardiac Enzymes No results found for this basename: CKTOTAL:4,CKMB:4,TROPONINI:4 in the last 72 hours CBC Lab Results  Component Value Date   WBC 5.4 10/23/2009   HGB 13.0 10/23/2009    HCT 38.0 10/23/2009   MCV 90.7 10/23/2009   PLT 205 10/23/2009     EKG:NSR 89 .13/08/34 No delta wave   Assessment and Plan:  [name]

## 2012-06-19 NOTE — Patient Instructions (Signed)
Your physician has recommended that you have an ablation. Catheter ablation is a medical procedure used to treat some cardiac arrhythmias (irregular heartbeats). During catheter ablation, a long, thin, flexible tube is put into a blood vessel in your groin (upper thigh), or neck. This tube is called an ablation catheter. It is then guided to your heart through the blood vessel. Radio frequency waves destroy small areas of heart tissue where abnormal heartbeats may cause an arrhythmia to start. Please see the instruction sheet given to you today.    See instruction sheet for ablation 

## 2012-06-19 NOTE — Assessment & Plan Note (Signed)
Will see Dr Graciela Husbands this afternoon I think ablation is indicated.  Started to discuss issues of low risk need for pacer and other procedural details but these will be discussed by Dr Graciela Husbands further.  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

## 2012-06-19 NOTE — Progress Notes (Signed)
Patient ID: [name], female   DOB: 1971-02-22, 41 y.o.   MRN: [id number] 41 yo last seen in 2008  History of infrequent PSVT.  Reviewed records from 5 years ago.  Normal stress echo.  Cardionet monitor with no arrhythmia Echo normal with no structural heart disease.  Had PRN inderal prescribed.  At the time she did not want to proceed with ablation However she now is having increasing episodes that last 3-4 hours and she cannot make them go away.  Gets lightheaded with them  She hikes, and travels including school trips to [place] and is concerned about having episodes while isolated.  Sudden onset cannot break with vagal maneuvers now.  Inderal has expired.  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  Dr Graciela Husbands can see this afternoon  ROS: Denies fever, malais, weight loss, blurry vision, decreased visual acuity, cough, sputum, SOB, hemoptysis, pleuritic pain, palpitaitons, heartburn, abdominal pain, melena, lower extremity edema, claudication, or rash.  All other systems reviewed and negative   General: Affect appropriate Healthy:  appears stated age HEENT: normal Neck supple with no adenopathy JVP normal no bruits no thyromegaly Lungs clear with no wheezing and good diaphragmatic motion Heart:  S1/S2 no murmur,rub, gallop or click PMI normal Abdomen: benighn, BS positve, no tenderness, no AAA no bruit.  No HSM or HJR Distal pulses intact with no bruits No edema Neuro non-focal Skin warm and dry No muscular weakness  Medications Current Outpatient Prescriptions  Medication Sig Dispense Refill  . calcium carbonate 200 MG capsule Take 250 mg by mouth 2 (two) times daily with a meal.      . pediatric multivitamin + iron (POLY-VI-SOL +IRON) 10 MG/ML oral solution Take 1 mL by mouth daily.        Allergies Review of patient's allergies indicates no known allergies.  Family History: Family History  Problem Relation Age of Onset  . Heart disease Father   . Hypertension  Father   . Diabetes Father   . Emphysema Sister   . Ulcerative colitis Sister   . Heart disease Maternal Grandfather   . Heart disease Paternal Grandfather   . Emphysema Paternal Grandfather     Social History: History   Social History  . Marital Status: Single    Spouse Name: N/A    Number of Children: N/A  . Years of Education: N/A   Occupational History  . Not on file.   Social History Main Topics  . Smoking status: Never Smoker   . Smokeless tobacco: Never Used  . Alcohol Use: No  . Drug Use: No  . Sexually Active: Yes    Birth Control/ Protection: None   Other Topics Concern  . Not on file   Social History Narrative  . No narrative on file    Electrocardiogram:  NSR rate 89 normal with no pre-excitation  Assessment and Plan

## 2012-06-19 NOTE — Assessment & Plan Note (Signed)
Patient with frequent episodes and increased filling frequent episodes of abrupt onset offset tachycardia with documented SVT.  We discussed treatment options including medication therapy as well as catheter ablation. She to pursue catheter ablation. We have reviewed potential risks including but not limited to death perforation and heart attack as well as the possibility of perforation and heart block requiring pacemaker implantation.  I will have her reviewed the situation with Dr. Ladona Ridgel

## 2012-06-19 NOTE — Patient Instructions (Signed)
Your physician recommends that you schedule a follow-up appointment OZ:[id number]  DR Graciela Husbands TODAY AT  2:30 PM Your physician recommends that you continue on your current medications as directed. Please refer to the Current Medication list given to you today.

## 2012-06-20 LAB — CBC WITH DIFFERENTIAL/PLATELET
Basophils Absolute: 0 10*3/uL (ref 0.0–0.1)
Basophils Relative: 0.4 % (ref 0.0–3.0)
HCT: 40.2 % (ref 36.0–46.0)
Hemoglobin: 13.3 g/dL (ref 12.0–15.0)
Lymphs Abs: 2.3 10*3/uL (ref 0.7–4.0)
MCHC: 33 g/dL (ref 30.0–36.0)
Monocytes Relative: 8 % (ref 3.0–12.0)
Neutro Abs: 5.5 10*3/uL (ref 1.4–7.7)
RBC: 4.5 Mil/uL (ref 3.87–5.11)
RDW: 13.1 % (ref 11.5–14.6)

## 2012-06-20 LAB — BASIC METABOLIC PANEL
CO2: 26 mEq/L (ref 19–32)
Glucose, Bld: 100 mg/dL — ABNORMAL HIGH (ref 70–99)
Potassium: 4.5 mEq/L (ref 3.5–5.1)
Sodium: 141 mEq/L (ref 135–145)

## 2012-06-22 ENCOUNTER — Other Ambulatory Visit: Payer: Self-pay | Admitting: *Deleted

## 2012-06-25 ENCOUNTER — Encounter (HOSPITAL_COMMUNITY): Payer: Self-pay | Admitting: General Practice

## 2012-06-25 ENCOUNTER — Ambulatory Visit (HOSPITAL_COMMUNITY)
Admission: RE | Admit: 2012-06-25 | Discharge: 2012-06-26 | Disposition: A | Discharge status: Home / Self Care | Payer: BC Managed Care – PPO | Source: Ambulatory Visit | Attending: Internal Medicine | Admitting: Internal Medicine

## 2012-06-25 ENCOUNTER — Encounter (HOSPITAL_COMMUNITY)
Admission: RE | Disposition: A | Discharge status: Home / Self Care | Payer: Self-pay | Source: Ambulatory Visit | Attending: Internal Medicine

## 2012-06-25 DIAGNOSIS — Z8632 Personal history of gestational diabetes: Secondary | ICD-10-CM

## 2012-06-25 DIAGNOSIS — K469 Unspecified abdominal hernia without obstruction or gangrene: Secondary | ICD-10-CM

## 2012-06-25 DIAGNOSIS — I498 Other specified cardiac arrhythmias: Secondary | ICD-10-CM | POA: Insufficient documentation

## 2012-06-25 DIAGNOSIS — I471 Supraventricular tachycardia: Secondary | ICD-10-CM

## 2012-06-25 HISTORY — DX: Shortness of breath: R06.02

## 2012-06-25 HISTORY — PX: SUPRAVENTRICULAR TACHYCARDIA ABLATION: SHX5492

## 2012-06-25 HISTORY — DX: Calculus of kidney: N20.0

## 2012-06-25 HISTORY — DX: Gestational diabetes mellitus in pregnancy, unspecified control: O24.419

## 2012-06-25 HISTORY — PX: SUPRAVENTRICULAR TACHYCARDIA ABLATION: SHX6106

## 2012-06-25 SURGERY — SUPRAVENTRICULAR TACHYCARDIA ABLATION
Anesthesia: LOCAL

## 2012-06-25 MED ORDER — MIDAZOLAM HCL 5 MG/5ML IJ SOLN
INTRAMUSCULAR | Status: AC
  Filled 2012-06-25: qty 5

## 2012-06-25 MED ORDER — SODIUM CHLORIDE 0.9 % IJ SOLN
3.0000 mL | Freq: Two times a day (BID) | INTRAMUSCULAR | Status: DC
  Administered 2012-06-25: 3 mL via INTRAVENOUS

## 2012-06-25 MED ORDER — SODIUM CHLORIDE 0.9 % IV SOLN: 250.0000 mL | INTRAVENOUS | Status: DC | PRN

## 2012-06-25 MED ORDER — IBUPROFEN 400 MG PO TABS
400.0000 mg | ORAL_TABLET | Freq: Four times a day (QID) | ORAL | Status: DC | PRN
  Administered 2012-06-25: 400 mg via ORAL
  Filled 2012-06-25 (×2): qty 1

## 2012-06-25 MED ORDER — HYDROXYUREA 500 MG PO CAPS
ORAL_CAPSULE | ORAL | Status: AC
  Filled 2012-06-25: qty 1

## 2012-06-25 MED ORDER — FENTANYL CITRATE 0.05 MG/ML IJ SOLN
INTRAMUSCULAR | Status: AC
  Filled 2012-06-25: qty 2

## 2012-06-25 MED ORDER — SODIUM CHLORIDE 0.9 % IJ SOLN: 3.0000 mL | INTRAMUSCULAR | Status: DC | PRN

## 2012-06-25 MED ORDER — ONDANSETRON HCL 4 MG/2ML IJ SOLN: 4.0000 mg | Freq: Four times a day (QID) | INTRAMUSCULAR | Status: DC | PRN

## 2012-06-25 MED ORDER — VERAPAMIL HCL 2.5 MG/ML IV SOLN
INTRAVENOUS | Status: AC
  Filled 2012-06-25: qty 2

## 2012-06-25 MED ORDER — ACETAMINOPHEN 325 MG PO TABS
650.0000 mg | ORAL_TABLET | ORAL | Status: DC | PRN
  Filled 2012-06-25: qty 2

## 2012-06-25 MED ORDER — OFF THE BEAT BOOK
Freq: Once | Status: AC
  Administered 2012-06-25: 21:00:00
  Filled 2012-06-25: qty 1

## 2012-06-25 MED ORDER — BUPIVACAINE HCL (PF) 0.25 % IJ SOLN
INTRAMUSCULAR | Status: AC
  Filled 2012-06-25: qty 60

## 2012-06-25 NOTE — Interval H&P Note (Signed)
History and Physical Interval Note: Patient seen and examined. Agree with abovel exam, assessment and plan.   06/25/2012 1:45 PM  [name]  has presented today for surgery, with the diagnosis of SVT  The various methods of treatment have been discussed with the patient and family. After consideration of risks, benefits and other options for treatment, the patient has consented to  Procedure(s) (LRB) with comments: SUPRAVENTRICULAR TACHYCARDIA ABLATION (N/A) as a surgical intervention .  The patient's history has been reviewed, patient examined, no change in status, stable for surgery.  I have reviewed the patient's chart and labs.  Questions were answered to the patient's satisfaction.     Leonia Reeves.D.

## 2012-06-25 NOTE — Op Note (Signed)
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 [id number].

## 2012-06-25 NOTE — H&P (View-Only) (Signed)
 ELECTROPHYSIOLOGY CONSULT NOTE  Patient ID: [name], MRN: [id number], DOB/AGE: 41/25/1972 41 y.o. Admit date: (Not on file) Date of Consult: 06/19/2012  Primary Physician: DOOLITTLE, ROBERT P, MD Primary Cardiologist: PN Chief Complaint: svyt   HPI [name] is a 41 y.o. female with a history of SVT dating back at least 5 or 6 years. I apparently saw her in 2008 At that time episodes were occurring semiannually with modest symptoms. Unfortunately, they have increased in frequency and now it had 3 episodes in the last couple of months many of which have lasted for hours and has not really stopped with Valsalva. The associated with significant fatigue, shortness of breath is not clear to me but they seem to be fraught negative. There of are precipitated by bending over. Apart from this she has noted no problems with exercise tolerance. She notes no specific triggers. Echocardiogram 2000 it was normal       Past Medical History  Diagnosis Date  . Hx of gestational diabetes mellitus, not currently pregnant   . Hernia 2011  . PSVT (paroxysmal supraventricular tachycardia)       Surgical History:  Past Surgical History  Procedure Date  . Cesarean section   . Wisdom tooth extraction      Home Meds: Prior to Admission medications   Medication Sig Start Date End Date Taking? Authorizing Provider  calcium carbonate 200 MG capsule Take 250 mg by mouth 2 (two) times daily with a meal.   Yes Historical Provider, MD  ibuprofen (ADVIL,MOTRIN) 400 MG tablet Take 400 mg by mouth every 6 (six) hours as needed.   Yes Historical Provider, MD  Multiple Vitamin (MULTIVITAMIN) capsule Take 1 capsule by mouth daily.   Yes Historical Provider, MD      Allergies: No Known Allergies  History   Social History  . Marital Status: Single    Spouse Name: N/A    Number of Children: N/A  . Years of Education: N/A   Occupational History  . Not on file.   Social History Main  Topics  . Smoking status: Never Smoker   . Smokeless tobacco: Never Used  . Alcohol Use: No  . Drug Use: No  . Sexually Active: Yes    Birth Control/ Protection: None   Other Topics Concern  . Not on file   Social History Narrative  . No narrative on file   2 children 5/11  Family History  Problem Relation Age of Onset  . Heart disease Father   . Hypertension Father   . Diabetes Father   . Emphysema Sister   . Ulcerative colitis Sister   . Heart disease Maternal Grandfather   . Heart disease Paternal Grandfather   . Emphysema Paternal Grandfather      ROS:  Please see the history of present illness.   All other systems reviewed and negative.    Physical Exam: A Blood pressure 130/70, pulse 89, resp. rate 16, height 5' 2" (1.575 m), weight 151 lb 12.8 oz (68.856 kg). General: Well developed, well nourished female in no acute distress. Head: Normocephalic, atraumatic, sclera non-icteric, no xanthomas, nares are without discharge. Lymph Nodes:  none Back: without scoliosis/kyphosis, no CVA tendersness Neck: Negative for carotid bruits. JVD not elevated. Lungs: Clear bilaterally to auscultation without wheezes, rales, or rhonchi. Breathing is unlabored. Heart: RRR with S1 S2.  2/6  murmur , rubs, or gallops appreciated. Abdomen: Soft, non-tender, non-distended with normoactive bowel sounds. No hepatomegaly. No rebound/guarding. No   obvious abdominal masses. Msk:  Strength and tone appear normal for age. Extremities: No clubbing or cyanosis. No edema.  Distal pedal pulses are 2+ and equal bilaterally. Skin: Warm and Dry Neuro: Alert and oriented X 3. CN III-XII intact Grossly normal sensory and motor function . Psych:  Responds to questions appropriately with a normal affect.      Labs: Cardiac Enzymes No results found for this basename: CKTOTAL:4,CKMB:4,TROPONINI:4 in the last 72 hours CBC Lab Results  Component Value Date   WBC 5.4 10/23/2009   HGB 13.0 10/23/2009    HCT 38.0 10/23/2009   MCV 90.7 10/23/2009   PLT 205 10/23/2009     EKG:NSR 89 .13/08/34 No delta wave   Assessment and Plan:  [name]   

## 2012-06-26 NOTE — Op Note (Signed)
[name], [name] NO.:  [id number]  MEDICAL RECORD NO.:  [id number]  LOCATION:  [facility]                         FACILITY:  [facility]  PHYSICIAN:  Doylene Canning. 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  INTRODUCTION:  The patient is a very pleasant 41 year old woman with a history of recurrent tachy palpitations, which have been present for many years.  She had sustained SVT at rates of over 200 beats per minute, which last for over an hour, sometimes several hours.  She has had to seek medical treatment on multiple occasions.  She is now referred for catheter ablation.  DESCRIPTION OF PROCEDURE:  After informed was obtained, the patient was taken to the diagnostic EP lab in a fasting state.  After usual preparation and draping, intravenous fentanyl and midazolam were given for sedation.  After usual preparation and draping, the 6-French quadripolar catheter was inserted percutaneously in the right femoral vein and advanced to the right ventricle.  A 6-French quadripolar catheter was inserted percutaneously in the right femoral vein and advanced to the His bundle region.  A 6-French hexapolar catheter was inserted percutaneously in the right femoral vein, and advanced to the coronary sinus.  After measurement of the basic intervals, rapid ventricular pacing was carried out from the right ventricle at base drive cycle length of 147 milliseconds and stepwise decreased down to 500 milliseconds where VA Wenckebach was observed.  Next, programed ventricular stimulation was carried out from the right ventricle at a base drive cycle length of 829 milliseconds and the S1-S2 interval stepwise decreased down to 360 milliseconds with retrograde AV node ERP was  observed.  During programed ventricular stimulation, the atrial activation sequence was midline and decremental.  Next, programed atrial stimulation was carried out from the atrium at base drive cycle length of 562 milliseconds in the S1-S2 interval stepwise decreased from 440 milliseconds down to 330 milliseconds with AV node ERP was observed. During programed atrial stimulation, there were no AH jumps, no echo beats, no inducible SVT.  Next, rapid atrial pacing was carried out from the coronary sinus.  An atrium at base drive cycle length of 130 milliseconds and stepwise decreased down to 400 milliseconds where AV Wenckebach was observed.  Again, during rapid atrial pacing the PR interval was less than the RR interval and there was no inducible SVT. Isuprel was then infused at rates of 1-4 mcg per minute.  Additional rapid atrial and ventricular pacing and programed atrial and ventricular stimulation were carried out and initially there was no SVT.  On careful examination of the atrial activation sequence, during ventricular pacing, I initially wondered if the activation sequence was fused and somewhat eccentric.  Verapamil was given at 2.5 mg boluses for a total of 7.5 mg and additional ventricular pacing carried out and this demonstrated no clearcut accessory pathway conduction.  Isoproterenol finally was increased to rates of between 4 and 5 mcg per minute and additional rapid atrial pacing was carried  out down to 220 milliseconds. This resulted in the initiation of SVT.  The SVT was a narrow QRS tachycardia and the rate was between 210-230 beats per minute.  PVCs replaced at time of His bundle refractoriness and these did not pre- excite the atrium and ventricular pacing during tachycardia demonstrated a VA-AV activation sequence.  With all of the above, a diagnosis of AV node reentrant tachycardia was made and a 7-French quadripolar ablation catheter was then inserted percutaneously  in the right femoral vein and advanced into the region of Koch's triangle.  Mapping of Koch's triangle was then carried out.  It was very small.  Four RF energy applications were delivered to sites for through 7 in Koch's triangle.  This resulted in accelerated junctional rhythm and following RF energy application isoproterenol was reinitiated at rates of 4-5 mcg per minute.  Despite isoproterenol additional rapid atrial pacing following catheter ablation failed to induce recurrent tachycardia and the PR interval was less than the RR interval following catheter ablation.  With all of the above, and after confirmation of no inducible SVT on isoproterenol with rapid atrial pacing, the catheters were removed.  Hemostasis was assured and the patient was returned to her room in satisfactory condition.  COMPLICATIONS:  There were no immediate procedure complications.  RESULTS:  A.  Baseline ECG:  Baseline ECG demonstrates sinus rhythm with normal axis and intervals. B.  Baseline intervals:  Sinus node cycle length was 730 milliseconds. The QRS duration 79 milliseconds.  The HV interval 32 milliseconds.  The PR interval was 127 milliseconds.  Following ablation, there was no change in intervals. C.  Rapid ventricular pacing:  The rapid ventricular pacing was carried out from the right ventricle at base drive cycle length of 914 milliseconds and stepwise decreased down to 4 milliseconds where VA Wenckebach was observed.  During rapid ventricular pacing, the atrial activation sequence was midline and decremental. D.  Programed ventricular stimulation:  The programed ventricular stimulation was carried out from the right ventricle at a base drive cycle length of 782 milliseconds.  The S1-S2 interval stepwise decreased down to 360 milliseconds where the retrograde AV node ERP was observed. During programed ventricular stimulation, the atrial activation was midline and decremental. E.  Rapid  atrial pacing:  The rapid atrial pacing was carried out from the right atrium and the coronary sinus at base drive cycle length of 956 milliseconds and stepwise decreased down to 400 milliseconds where AV Wenckebach was observed.  During rapid atrial pacing on isoproterenol, there was inducible SVT. F.  Programed atrial stimulation:  The programed atrial stimulation was carried out from the right atrium and a coronary sinus at base drive cycle length of 213, 500, 400 milliseconds.  The S1-S2 interval stepwise decreased down to atrial refractoriness.  During programed atrial stimulation, there were no AH jumps and echo beats no inducible SVT. G.  Arrhythmias observed. 1. AV node reentrant tachycardia initiation was with rapid atrial     pacing on isoproterenol.  The duration was sustained, cycle length     was 290 milliseconds.     a.     Mapping:  Mapping of Koch's triangle was much smaller than      usual.     b.     RF energy application.  A total 4 RF energy applications      were delivered to sites 4 through 7 in Koch's triangle resulted in      accelerated junctional rhythm.  Following catheter  ablation, there      were no inducible SVT.  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  It should be noted the tachycardia was very difficult to induce and required high-dose isoproterenol.     Doylene Canning. Ladona Ridgel, MD     GWT/MEDQ  D:  06/25/2012  T:  06/26/2012  Job:  [id number]  cc:   Duke Salvia, MD, [facility]

## 2012-06-26 NOTE — Discharge Summary (Addendum)
    ELECTROPHYSIOLOGY PROCEDURE DISCHARGE SUMMARY    Patient ID: [name],  MRN: [id number], DOB/AGE: 41-Feb-1972 41 y.o.  Admit date: 06/25/2012 Discharge date: 07/06/2012  Primary Cardiologist: Charlton Haws, MD Electrophysiologist: Lewayne Bunting, MD  Primary Discharge Diagnosis:  SVT s/p ablation this admission   Procedures This Admission: 1.  Electrophysiology study and radiofrequency catheter ablation of AVNRT on 06-25-2012 by Dr Ladona Ridgel.  This demonstrated successful ablation of AVNRT with no early apparent complications.   Brief HPI: [name] is a 41 year old female with a longstanding history of tachy palpitations.  These had been managed in the past with prn inderal.  They have persisted and she wished to pursue definitive treatment.  She was referred to EP and consented to EPS/ablation.   Hospital Course:  The patient was admitted and underwent EPS/ablation of SVT with details as outlined above.  She was monitored on telemetry overnight which demonstrated sinus rhythm.  Her groin incision was without complication.  Dr Ladona Ridgel examined the patient and considered her stable for discharge to home.   Discharge Vitals: Blood pressure 119/76, pulse 88, temperature 98 F (36.7 C), temperature source Oral, resp. rate 12, height 5\' 2"  (1.575 m), weight 153 lb 3.5 oz (69.5 kg), last menstrual period 06/24/2012, SpO2 98.00%.    Labs:   Lab Results  Component Value Date   WBC 8.6 06/19/2012   HGB 13.3 06/19/2012   HCT 40.2 06/19/2012   MCV 89.4 06/19/2012   PLT 239.0 06/19/2012   No results found for this basename: NA,K,CL,CO2,BUN,CREATININE,CALCIUM,LABALBU,PROT,BILITOT,ALKPHOS,ALT,AST,GLUCOSE in the last 168 hours  Discharge Medications:    Medication List     As of 07/06/2012  7:25 AM    TAKE these medications         calcium carbonate 200 MG capsule   Take 250 mg by mouth 2 (two) times daily with a meal.      ibuprofen 200 MG tablet   Commonly known as:  ADVIL,MOTRIN   Take 800 mg by mouth every 6 (six) hours as needed. For pain.      multivitamin capsule   Take 1 capsule by mouth daily.         Disposition:  Discharge Orders    Future Appointments: Provider: Department: Dept Phone: Center:   07/31/2012 1:45 PM Marinus Maw, MD [facility] Main Office [place]) [phone] [facility]     Future Orders Please Complete By Expires   Diet - low sodium heart healthy      Increase activity slowly        Follow-up Information    Follow up with Lewayne Bunting, MD. On 07/31/2012. (At 1:45 PM)    Contact information:   [address] [place] [address] [phone]          Duration of Discharge Encounter: Greater than 30 minutes including physician time.  Signed, Gypsy Balsam, RN, BSN 07/06/2012, 7:25 AM  EP Attending  Patient seen and examined. Agree with above.  Leonia Reeves.D.

## 2012-07-10 ENCOUNTER — Ambulatory Visit: Payer: BC Managed Care – PPO | Admitting: Cardiovascular Disease

## 2012-07-31 ENCOUNTER — Ambulatory Visit (INDEPENDENT_AMBULATORY_CARE_PROVIDER_SITE_OTHER): Payer: BC Managed Care – PPO | Admitting: Internal Medicine

## 2012-07-31 ENCOUNTER — Encounter: Payer: Self-pay | Admitting: Internal Medicine

## 2012-07-31 VITALS — BP 139/99 | HR 85 | Ht 62.0 in | Wt 156.0 lb

## 2012-07-31 DIAGNOSIS — I471 Supraventricular tachycardia: Secondary | ICD-10-CM

## 2012-07-31 NOTE — Patient Instructions (Signed)
Your physician recommends that you schedule a follow-up appointment as needed  

## 2012-08-02 ENCOUNTER — Encounter: Payer: Self-pay | Admitting: Internal Medicine

## 2012-08-02 NOTE — Progress Notes (Signed)
HPI Ms. Borror returns today for followup. She is a very pleasant middle aged woman with a h/o SVT and palpitations who underwent EPS/RFA of SVT several weeks ago. She has done well in the interim. Since her ablation several weeks ago, she denies chest pain, sob, or syncope. No Known Allergies   Current Outpatient Prescriptions  Medication Sig Dispense Refill  . calcium carbonate 200 MG capsule Take 250 mg by mouth 2 (two) times daily with a meal.      . ibuprofen (ADVIL,MOTRIN) 200 MG tablet Take 800 mg by mouth every 6 (six) hours as needed. For pain.      . Multiple Vitamin (MULTIVITAMIN) capsule Take 1 capsule by mouth daily.         Past Medical History  Diagnosis Date  . PSVT (paroxysmal supraventricular tachycardia)   . Gestational diabetes mellitus in pregnancy   . Shortness of breath     "during SVT attacks; they would go on for hours" (06/25/2012)  . Kidney stones     "quite a few times since I was 16" (06/25/2012)    ROS:   All systems reviewed and negative except as noted in the HPI.   Past Surgical History  Procedure Date  . Cesarean section 2002; 2009  . Wisdom tooth extraction ? 1990's  . Inguinal hernia repair 2011    right  . Supraventricular tachycardia ablation 06/25/2012     Family History  Problem Relation Age of Onset  . Heart disease Father   . Hypertension Father   . Diabetes Father   . Emphysema Sister   . Ulcerative colitis Sister   . Heart disease Maternal Grandfather   . Heart disease Paternal Grandfather   . Emphysema Paternal Grandfather      History   Social History  . Marital Status: Single    Spouse Name: N/A    Number of Children: N/A  . Years of Education: N/A   Occupational History  . Not on file.   Social History Main Topics  . Smoking status: Never Smoker   . Smokeless tobacco: Never Used  . Alcohol Use: No  . Drug Use: No  . Sexually Active: Yes    Birth Control/ Protection: None   Other Topics Concern  .  Not on file   Social History Narrative  . No narrative on file     BP 139/99  Pulse 85  Ht 5\' 2"  (1.575 m)  Wt 156 lb (70.761 kg)  BMI 28.53 kg/m2  Physical Exam:  Well appearing 42 yo woman, NAD HEENT: Unremarkable Neck:  No JVD, no thyromegally Lungs:  Clear with no wheezes, rales, or rhonchi HEART:  Regular rate rhythm, no murmurs, no rubs, no clicks Abd:  soft, positive bowel sounds, no organomegally, no rebound, no guarding Ext:  2 plus pulses, no edema, no cyanosis, no clubbing Skin:  No rashes no nodules Neuro:  CN II through XII intact, motor grossly intact  EKG nsr  Assess/Plan:

## 2012-08-02 NOTE — Assessment & Plan Note (Signed)
She has had no recurrence since her ablation. I will see her back on an as needed basis.

## 2013-03-08 ENCOUNTER — Other Ambulatory Visit: Payer: Self-pay

## 2013-03-08 DIAGNOSIS — Z1231 Encounter for screening mammogram for malignant neoplasm of breast: Secondary | ICD-10-CM

## 2013-03-22 ENCOUNTER — Ambulatory Visit
Admission: RE | Admit: 2013-03-22 | Discharge: 2013-03-22 | Disposition: A | Discharge status: Home / Self Care | Payer: BC Managed Care – PPO | Source: Ambulatory Visit

## 2013-03-22 DIAGNOSIS — Z1231 Encounter for screening mammogram for malignant neoplasm of breast: Secondary | ICD-10-CM

## 2013-10-04 ENCOUNTER — Encounter: Payer: Self-pay | Admitting: Neurology

## 2013-10-08 ENCOUNTER — Ambulatory Visit: Payer: BC Managed Care – PPO | Admitting: Neurology

## 2013-10-15 ENCOUNTER — Encounter: Payer: Self-pay | Admitting: Neurology

## 2013-10-15 ENCOUNTER — Encounter (INDEPENDENT_AMBULATORY_CARE_PROVIDER_SITE_OTHER): Payer: Self-pay

## 2013-10-15 ENCOUNTER — Ambulatory Visit (INDEPENDENT_AMBULATORY_CARE_PROVIDER_SITE_OTHER): Payer: BC Managed Care – PPO | Admitting: Neurology

## 2013-10-15 VITALS — BP 127/84 | HR 98 | Ht 62.0 in | Wt 160.0 lb

## 2013-10-15 DIAGNOSIS — G43009 Migraine without aura, not intractable, without status migrainosus: Secondary | ICD-10-CM

## 2013-10-15 MED ORDER — RIZATRIPTAN BENZOATE 10 MG PO TBDP: 10.0000 mg | ORAL_TABLET | Freq: Three times a day (TID) | ORAL | Status: DC | PRN

## 2013-10-15 NOTE — Progress Notes (Signed)
Reason for visit: Headache  [name] is a 43 y.o. female  History of present illness:  Ms. Ridings is a 43 year old right-handed white female with a history of migraine headache that began in September of 2014. The patient indicates that she does not have a history of headache during her lifetime, and no family history of migraine. The patient noted that the headache would come on 4 or 5 days prior to her menstrual cycle. The patient was found to have a low progesterone level, and she received progesterone supplementation, but was not successful in bringing her levels up. The patient has continued to have one or 2 headaches a month, again the headaches are always prior to her menstrual cycle. The headaches are bifrontal in nature associated with a pressure sensation, nausea, and photophobia and phonophobia. The patient indicates that the headaches may last all day long. The patient may have dizziness associated with the headache. The patient reports no numbness or weakness with the headache, visual disturbance, or confusion with the migraine itself. The patient denies any neck stiffness, balance issues, or problems controlling the bowels or the bladder. The patient is sent to this office for further evaluation. The patient was taking Midrin for the headache, but this resulted in significant dizziness and drowsiness.  Past Medical History  Diagnosis Date  . PSVT (paroxysmal supraventricular tachycardia)   . Gestational diabetes mellitus in pregnancy   . Shortness of breath     "during SVT attacks; they would go on for hours" (06/25/2012)  . Kidney stones     "quite a few times since I was 16" (06/25/2012)  . Migraine without aura, without mention of intractable migraine without mention of status migrainosus     Past Surgical History  Procedure Laterality Date  . Cesarean section  2002; 2009  . Wisdom tooth extraction  ? 1990's  . Inguinal hernia repair  2011    right  .  Supraventricular tachycardia ablation  06/25/2012    Family History  Problem Relation Age of Onset  . Heart disease Father   . Hypertension Father   . Diabetes Father   . Emphysema Sister   . Ulcerative colitis Sister   . Heart disease Maternal Grandfather     heart disease  . Heart disease Paternal Grandfather   . Emphysema Paternal Grandfather   . Cancer Mother   . Migraines Neg Hx     Social history:  reports that she has never smoked. She has never used smokeless tobacco. She reports that she does not drink alcohol or use illicit drugs.  Medications:  Current Outpatient Prescriptions on File Prior to Visit  Medication Sig Dispense Refill  . calcium carbonate 200 MG capsule Take 250 mg by mouth 2 (two) times daily with a meal.      . ibuprofen (ADVIL,MOTRIN) 200 MG tablet Take 800 mg by mouth every 6 (six) hours as needed. For pain.      . Multiple Vitamin (MULTIVITAMIN) capsule Take 1 capsule by mouth daily.      Marland Kitchen isometheptene-acetaminophen-dichloralphenazone (MIDRIN) 65-325-100 MG capsule Take 2 capsules by mouth 4 (four) times daily as needed for migraine. Maximum 5 capsules in 12 hours for migraine headaches, 8 capsules in 24 hours for tension headaches.       No current facility-administered medications on file prior to visit.     No Known Allergies  ROS:  Out of a complete 14 system review of symptoms, the patient complains only of the  following symptoms, and all other reviewed systems are negative.  Weight gain Headache Insomnia  Blood pressure 127/84, pulse 98, height 5\' 2"  (1.575 m), weight 160 lb (72.576 kg).  Physical Exam  General: The patient is alert and cooperative at the time of the examination. The patient is minimally to moderately obese.  Eyes: Pupils are equal, round, and reactive to light. Discs are flat bilaterally.  Neck: The neck is supple, no carotid bruits are noted.  Respiratory: The respiratory examination is  clear.  Cardiovascular: The cardiovascular examination reveals a regular rate and rhythm, no obvious murmurs or rubs are noted.  Neuromuscular: range of movement of the cervical spine is full. The patient has no crepitus within the temporomandibular joints on either side.   Skin: Extremities are without significant edema.  Neurologic Exam  Mental status: The patient is alert and oriented x 3 at the time of the examination. The patient has apparent normal recent and remote memory, with an apparently normal attention span and concentration ability.  Cranial nerves: Facial symmetry is present. There is good sensation of the face to pinprick and soft touch bilaterally. The strength of the facial muscles and the muscles to head turning and shoulder shrug are normal bilaterally. Speech is well enunciated, no aphasia or dysarthria is noted. Extraocular movements are full. Visual fields are full. The tongue is midline, and the patient has symmetric elevation of the soft palate. No obvious hearing deficits are noted.  Motor: The motor testing reveals 5 over 5 strength of all 4 extremities. Good symmetric motor tone is noted throughout.  Sensory: Sensory testing is intact to pinprick, soft touch, vibration sensation, and position sense on all 4 extremities. No evidence of extinction is noted.  Coordination: Cerebellar testing reveals good finger-nose-finger and heel-to-shin bilaterally.  Gait and station: Gait is normal. Tandem gait is normal. Romberg is negative. No drift is seen.  Reflexes: Deep tendon reflexes are symmetric and normal bilaterally. Toes are downgoing bilaterally.   Assessment/Plan:  1. Migraine headache, menstrual migraine  The patient is having 1 or 2 headaches a month. Daily prophylactic medications are not indicated. The patient will be given a prescription for Maxalt, and she is to try Excedrin Migraine when the headache comes on. The patient will followup through this  office in 4 or 5 months. The patient will contact our office if she is not doing well.  Marlan Palau. Keith Willis MD 10/15/2013 7:16 PM  [facility] [address] [place], [place] [id number]  Phone [phone] Fax [phone]

## 2013-10-15 NOTE — Patient Instructions (Signed)
Migraine Headache A migraine headache is an intense, throbbing pain on one or both sides of your head. A migraine can last for 30 minutes to several hours. CAUSES  The exact cause of a migraine headache is not always known. However, a migraine may be caused when nerves in the brain become irritated and release chemicals that cause inflammation. This causes pain. Certain things may also trigger migraines, such as:  Alcohol.  Smoking.  Stress.  Menstruation.  Aged cheeses.  Foods or drinks that contain nitrates, glutamate, aspartame, or tyramine.  Lack of sleep.  Chocolate.  Caffeine.  Hunger.  Physical exertion.  Fatigue.  Medicines used to treat chest pain (nitroglycerine), birth control pills, estrogen, and some blood pressure medicines. SIGNS AND SYMPTOMS  Pain on one or both sides of your head.  Pulsating or throbbing pain.  Severe pain that prevents daily activities.  Pain that is aggravated by any physical activity.  Nausea, vomiting, or both.  Dizziness.  Pain with exposure to bright lights, loud noises, or activity.  General sensitivity to bright lights, loud noises, or smells. Before you get a migraine, you may get warning signs that a migraine is coming (aura). An aura may include:  Seeing flashing lights.  Seeing bright spots, halos, or zig-zag lines.  Having tunnel vision or blurred vision.  Having feelings of numbness or tingling.  Having trouble talking.  Having muscle weakness. DIAGNOSIS  A migraine headache is often diagnosed based on:  Symptoms.  Physical exam.  A CT scan or MRI of your head. These imaging tests cannot diagnose migraines, but they can help rule out other causes of headaches. TREATMENT Medicines may be given for pain and nausea. Medicines can also be given to help prevent recurrent migraines.  HOME CARE INSTRUCTIONS  Only take over-the-counter or prescription medicines for pain or discomfort as directed by your  health care provider. The use of long-term narcotics is not recommended.  Lie down in a dark, quiet room when you have a migraine.  Keep a journal to find out what may trigger your migraine headaches. For example, write down:  What you eat and drink.  How much sleep you get.  Any change to your diet or medicines.  Limit alcohol consumption.  Quit smoking if you smoke.  Get 7 9 hours of sleep, or as recommended by your health care provider.  Limit stress.  Keep lights dim if bright lights bother you and make your migraines worse. SEEK IMMEDIATE MEDICAL CARE IF:   Your migraine becomes severe.  You have a fever.  You have a stiff neck.  You have vision loss.  You have muscular weakness or loss of muscle control.  You start losing your balance or have trouble walking.  You feel faint or pass out.  You have severe symptoms that are different from your first symptoms. MAKE SURE YOU:   Understand these instructions.  Will watch your condition.  Will get help right away if you are not doing well or get worse. Document Released: 07/18/2005 Document Revised: 05/08/2013 Document Reviewed: 03/25/2013 [employer] Patient Information 2014 [employer].  

## 2013-10-25 ENCOUNTER — Telehealth: Payer: Self-pay | Admitting: Neurology

## 2013-10-25 NOTE — Telephone Encounter (Signed)
Patient called stating Rx for migraine medication which was sent on 3-17 [facility] [address] was never received per the pharmacy--please resend--thank you

## 2013-10-25 NOTE — Telephone Encounter (Signed)
I called the pharmacy.  Spoke with pharmacist, Victorino DikeJennifer.  They will fill Rx today.

## 2013-11-14 ENCOUNTER — Telehealth: Payer: Self-pay | Admitting: Neurology

## 2013-11-14 MED ORDER — SUMATRIPTAN SUCCINATE 100 MG PO TABS: 100.0000 mg | ORAL_TABLET | Freq: Two times a day (BID) | ORAL | Status: DC | PRN

## 2013-11-14 NOTE — Telephone Encounter (Signed)
I called patient. The patient had some dizziness, and nausea. This may have been from the migraine or the oxycodone, likely not from the Maxalt. The patient only took 1 Maxalt, and didn't re\re dose. She does not like the taste of the [facility] tablet. I will call in a prescription for Imitrex.

## 2013-11-14 NOTE — Telephone Encounter (Signed)
Pt called states she took the medication rizatriptan (MAXALT-MLT) 10 MG disintegrating tablet and she took a Oxycodone later and got sick. Pt wants to know if there is something else she can take. I ask the pt if she thought maybe the Oxycodone could have maybe made her sick she said she thought about that and I told her it could have been to strong adding both together but that I would send this through and see what they can do. Thanks

## 2013-11-14 NOTE — Telephone Encounter (Signed)
I called back.  Patient said she had a migraine yesterday morning and took Maxalt.  The pain did not subside, so after 1-2 hours she took an Oxycodone (which was left over from a procedure she previously had).  She indicated the headache still did not go away and she later became dizzy and was vomiting.  She is unsure if it was the Maxalt or the Oxycodone that made her feel this way.  Indicates she feels somewhat better today, and should not get another migraine until next month, as they only seem to occur while she is on her cycle.  Says she does not care for the taste of the MLT tabs, and does not feel they work well for her.  Please advise.  Thank you.

## 2014-02-14 ENCOUNTER — Encounter: Payer: Self-pay | Admitting: Adult Health

## 2014-02-14 ENCOUNTER — Ambulatory Visit (INDEPENDENT_AMBULATORY_CARE_PROVIDER_SITE_OTHER): Payer: BC Managed Care – PPO | Admitting: Adult Health

## 2014-02-14 ENCOUNTER — Encounter (INDEPENDENT_AMBULATORY_CARE_PROVIDER_SITE_OTHER): Payer: Self-pay

## 2014-02-14 VITALS — BP 110/76 | HR 60 | Temp 98.1°F | Ht 62.0 in | Wt 159.0 lb

## 2014-02-14 DIAGNOSIS — G43009 Migraine without aura, not intractable, without status migrainosus: Secondary | ICD-10-CM

## 2014-02-14 NOTE — Patient Instructions (Addendum)
Migraine Headache A migraine headache is an intense, throbbing pain on one or both sides of your head. A migraine can last for 30 minutes to several hours. CAUSES  The exact cause of a migraine headache is not always known. However, a migraine may be caused when nerves in the brain become irritated and release chemicals that cause inflammation. This causes pain. Certain things may also trigger migraines, such as:  Alcohol.  Smoking.  Stress.  Menstruation.  Aged cheeses.  Foods or drinks that contain nitrates, glutamate, aspartame, or tyramine.  Lack of sleep.  Chocolate.  Caffeine.  Hunger.  Physical exertion.  Fatigue.  Medicines used to treat chest pain (nitroglycerine), birth control pills, estrogen, and some blood pressure medicines. SIGNS AND SYMPTOMS  Pain on one or both sides of your head.  Pulsating or throbbing pain.  Severe pain that prevents daily activities.  Pain that is aggravated by any physical activity.  Nausea, vomiting, or both.  Dizziness.  Pain with exposure to bright lights, loud noises, or activity.  General sensitivity to bright lights, loud noises, or smells. Before you get a migraine, you may get warning signs that a migraine is coming (aura). An aura may include:  Seeing flashing lights.  Seeing bright spots, halos, or zig-zag lines.  Having tunnel vision or blurred vision.  Having feelings of numbness or tingling.  Having trouble talking.  Having muscle weakness. DIAGNOSIS  A migraine headache is often diagnosed based on:  Symptoms.  Physical exam.  A CT scan or MRI of your head. These imaging tests cannot diagnose migraines, but they can help rule out other causes of headaches. TREATMENT Medicines may be given for pain and nausea. Medicines can also be given to help prevent recurrent migraines.  HOME CARE INSTRUCTIONS  Only take over-the-counter or prescription medicines for pain or discomfort as directed by your  health care provider. The use of long-term narcotics is not recommended.  Lie down in a dark, quiet room when you have a migraine.  Keep a journal to find out what may trigger your migraine headaches. For example, write down:  What you eat and drink.  How much sleep you get.  Any change to your diet or medicines.  Limit alcohol consumption.  Quit smoking if you smoke.  Get 7-9 hours of sleep, or as recommended by your health care provider.  Limit stress.  Keep lights dim if bright lights bother you and make your migraines worse. SEEK IMMEDIATE MEDICAL CARE IF:   Your migraine becomes severe.  You have a fever.  You have a stiff neck.  You have vision loss.  You have muscular weakness or loss of muscle control.  You start losing your balance or have trouble walking.  You feel faint or pass out.  You have severe symptoms that are different from your first symptoms. MAKE SURE YOU:   Understand these instructions.  Will watch your condition.  Will get help right away if you are not doing well or get worse. Document Released: 07/18/2005 Document Revised: 05/08/2013 Document Reviewed: 03/25/2013 [facility] Patient Information 2015 [place], [place]. This information is not intended to replace advice given to you by your health care provider. Make sure you discuss any questions you have with your health care provider. Sumatriptan tablets What is this medicine? SUMATRIPTAN (soo ma TRIP tan) is used to treat migraines with or without aura. An aura is a strange feeling or visual disturbance that warns you of an attack. It is not used to  prevent migraines. This medicine may be used for other purposes; ask your health care provider or pharmacist if you have questions. COMMON BRAND NAME(S): Imitrex What should I tell my health care provider before I take this medicine? They need to know if you have any of these conditions: -bowel disease or colitis -diabetes -family history of  heart disease -fast or irregular heart beat -heart or blood vessel disease, angina (chest pain), or previous heart attack -high blood pressure -high cholesterol -history of stroke, transient ischemic attacks (TIAs or mini-strokes), or intracranial bleeding -kidney or liver disease -overweight -poor circulation -postmenopausal or surgical removal of uterus and ovaries -Raynaud's disease -seizure disorder -an unusual or allergic reaction to sumatriptan, other medicines, foods, dyes, or preservatives -pregnant or trying to get pregnant -breast-feeding How should I use this medicine? Take this medicine by mouth with a glass of water. Follow the directions on the prescription label. This medicine is taken at the first symptoms of a migraine. It is not for everyday use. If your migraine headache returns after one dose, you can take another dose as directed. You must leave at least 2 hours between doses, and do not take more than 100 mg as a single dose. Do not take more than 200 mg total in any 24 hour period. If there is no improvement at all after the first dose, do not take a second dose without talking to your doctor or health care professional. Do not take your medicine more often than directed. Talk to your pediatrician regarding the use of this medicine in children. Special care may be needed. Overdosage: If you think you have taken too much of this medicine contact a poison control center or emergency room at once. NOTE: This medicine is only for you. Do not share this medicine with others. What if I miss a dose? This does not apply; this medicine is not for regular use. What may interact with this medicine? Do not take this medicine with any of the following medicines: -amphetamine or cocaine -dihydroergotamine, ergotamine, ergoloid mesylates, methysergide, or ergot-type medication - do not take within 24 hours of taking sumatriptan -feverfew -MAOIs like Carbex, Eldepryl, Marplan,  Nardil, and Parnate - do not take sumatriptan within 2 weeks of stopping MAOI therapy -other migraine medicines like almotriptan, eletriptan, naratriptan, rizatriptan, zolmitriptan - do not take within 24 hours of taking sumatriptan -tryptophan This medicine may also interact with the following medications: -lithium -medicines for mental depression, anxiety or mood problems -medicines for weight loss such as dexfenfluramine, dextroamphetamine, fenfluramine, or sibutramine -St. John's wort This list may not describe all possible interactions. Give your health care provider a list of all the medicines, herbs, non-prescription drugs, or dietary supplements you use. Also tell them if you smoke, drink alcohol, or use illegal drugs. Some items may interact with your medicine. What should I watch for while using this medicine? Only take this medicine for a migraine headache. Take it if you get warning symptoms or at the start of a migraine attack. It is not for regular use to prevent migraine attacks. You may get drowsy or dizzy. Do not drive, use machinery, or do anything that needs mental alertness until you know how this medicine affects you. To reduce dizzy or fainting spells, do not sit or stand up quickly, especially if you are an older patient. Alcohol can increase drowsiness, dizziness and flushing. Avoid alcoholic drinks. Smoking cigarettes may increase the risk of heart-related side effects from using this medicine. If you take  migraine medicines for 10 or more days a month, your migraines may get worse. Keep a diary of headache days and medicine use. Contact your healthcare professional if your migraine attacks occur more frequently. What side effects may I notice from receiving this medicine? Side effects that you should report to your doctor or health care professional as soon as possible: -allergic reactions like skin rash, itching or hives, swelling of the face, lips, or tongue -fast, slow,  or irregular heart beat -hallucinations -increased or decreased blood pressure -seizures -severe stomach pain and cramping, bloody diarrhea -signs and symptoms of a blood clot such as breathing problems; changes in vision; chest pain; severe, sudden headache; pain, swelling, warmth in the leg; trouble speaking; sudden numbness or weakness of the face, arm or leg -tingling, pain, or numbness in the face, hands or feet Side effects that usually do not require medical attention (report to your doctor or health care professional if they continue or are bothersome): -drowsiness -feeling warm, flushing, or redness of the face -headache -muscle cramps, pain -nausea, vomiting -unusually weak or tired This list may not describe all possible side effects. Call your doctor for medical advice about side effects. You may report side effects to FDA at 1-800-FDA-1088. Where should I keep my medicine? Keep out of the reach of children. Store at room temperature between 2 and 30 degrees C (36 and 86 degrees F). Throw away any unused medicine after the expiration date. NOTE: This sheet is a summary. It may not cover all possible information. If you have questions about this medicine, talk to your doctor, pharmacist, or health care provider.  2015, Elsevier/[employer] Standard. (2013-03-19 10:12:47)

## 2014-02-14 NOTE — Progress Notes (Signed)
PATIENT: [name] DOB: 03/13/1971  REASON FOR VISIT: follow up HISTORY FROM: patient  HISTORY OF PRESENT ILLNESS:  [name] is a 43 year old female with a history of migraines. She returns today for follow-up. The patient is currently taking Imitrex and states that she is unsure if the medication is helping. She only gets 1 headache a month usually around her menstrual cycle. She couldn't tolerate Maxalt. However she was not taking the Imitrex correctly. She was taking it once she had the full migraine rather than right at onset.    10/15/13 visit (CW): history of migraine headache that began in September of 2014. The patient indicates that she does not have a history of headache during her lifetime, and no family history of migraine. The patient noted that the headache would come on 4 or 5 days prior to her menstrual cycle. The patient was found to have a low progesterone level, and she received progesterone supplementation, but was not successful in bringing her levels up. The patient has continued to have one or 2 headaches a month, again the headaches are always prior to her menstrual cycle. The headaches are bifrontal in nature associated with a pressure sensation, nausea, and photophobia and phonophobia. The patient indicates that the headaches may last all day long. The patient may have dizziness associated with the headache. The patient reports no numbness or weakness with the headache, visual disturbance, or confusion with the migraine itself. The patient denies any neck stiffness, balance issues, or problems controlling the bowels or the bladder. The patient is sent to this office for further evaluation. The patient was taking Midrin for the headache, but this resulted in significant dizziness and drowsiness.   REVIEW OF SYSTEMS: Full 14 system review of systems performed and notable only for:  Constitutional: N/A  Eyes: N/A Ear/Nose/Throat: N/A  Skin: N/A  Cardiovascular:  N/A  Respiratory: N/A  Gastrointestinal: N/A  Genitourinary: N/A Hematology/Lymphatic: N/A  Endocrine: N/A Musculoskeletal:N/A  Allergy/Immunology: N/A  Neurological:Headache Psychiatric: N/A Sleep: N/A   ALLERGIES: No Known Allergies  HOME MEDICATIONS: Outpatient Prescriptions Prior to Visit  Medication Sig Dispense Refill  . calcium carbonate 200 MG capsule Take 250 mg by mouth 2 (two) times daily with a meal.      . ibuprofen (ADVIL,MOTRIN) 200 MG tablet Take 800 mg by mouth every 6 (six) hours as needed. For pain.      Marland Kitchen isometheptene-acetaminophen-dichloralphenazone (MIDRIN) 65-325-100 MG capsule Take 2 capsules by mouth 4 (four) times daily as needed for migraine. Maximum 5 capsules in 12 hours for migraine headaches, 8 capsules in 24 hours for tension headaches.      . Multiple Vitamin (MULTIVITAMIN) capsule Take 1 capsule by mouth daily.      . SUMAtriptan (IMITREX) 100 MG tablet Take 1 tablet (100 mg total) by mouth 2 (two) times daily as needed for migraine or headache. May repeat in 2 hours if headache persists or recurs.  9 tablet  2   No facility-administered medications prior to visit.    PAST MEDICAL HISTORY: Past Medical History  Diagnosis Date  . PSVT (paroxysmal supraventricular tachycardia)   . Gestational diabetes mellitus in pregnancy   . Shortness of breath     "during SVT attacks; they would go on for hours" (06/25/2012)  . Kidney stones     "quite a few times since I was 16" (06/25/2012)  . Migraine without aura, without mention of intractable migraine without mention of status migrainosus  PAST SURGICAL HISTORY: Past Surgical History  Procedure Laterality Date  . Cesarean section  2002; 2009  . Wisdom tooth extraction  ? 1990's  . Inguinal hernia repair  2011    right  . Supraventricular tachycardia ablation  06/25/2012    FAMILY HISTORY: Family History  Problem Relation Age of Onset  . Heart disease Father   . Hypertension Father   .  Diabetes Father   . Emphysema Sister   . Ulcerative colitis Sister   . Heart disease Maternal Grandfather     heart disease  . Heart disease Paternal Grandfather   . Emphysema Paternal Grandfather   . Cancer Mother   . Migraines Neg Hx     SOCIAL HISTORY: History   Social History  . Marital Status: Single    Spouse Name: N/A    Number of Children: 2  . Years of Education: N/A   Occupational History  .  [place] History Main Topics  . Smoking status: Never Smoker   . Smokeless tobacco: Never Used  . Alcohol Use: No  . Drug Use: No  . Sexual Activity: Yes    Birth Control/ Protection: None   Other Topics Concern  . Not on file   Social History Narrative  . No narrative on file      PHYSICAL EXAM  Filed Vitals:   02/14/14 0946  BP: 110/76  Pulse: 60  Temp: 98.1 F (36.7 C)  TempSrc: Oral  Height: 5' 2" (1.575 m)  Weight: 159 lb (72.122 kg)   Body mass index is 29.07 kg/(m^2).  Generalized: Well developed, in no acute distress   Neurological examination  Mentation: Alert oriented to time, place, history taking. Follows all commands speech and language fluent Cranial nerve II-XII:  Extraocular movements were full, visual field were full on confrontational test.  Motor: The motor testing reveals 5 over 5 strength of all 4 extremities. Good symmetric motor tone is noted throughout.  Sensory: Sensory testing is intact to soft touch on all 4 extremities. No evidence of extinction is noted.  Coordination: Cerebellar testing reveals good finger-nose-finger and heel-to-shin bilaterally.  Gait and station: Gait is normal. Tandem gait is normal. Romberg is negative. No drift is seen.  Reflexes: Deep tendon reflexes are symmetric and normal bilaterally.    DIAGNOSTIC DATA (LABS, IMAGING, TESTING) - I reviewed patient records, labs, notes, testing and imaging myself where available.  Lab Results  Component Value Date   WBC 8.6 06/19/2012     HGB 13.3 06/19/2012   HCT 40.2 06/19/2012   MCV 89.4 06/19/2012   PLT 239.0 06/19/2012      Component Value Date/Time   NA 141 06/19/2012 1655   K 4.5 06/19/2012 1655   CL 110 06/19/2012 1655   CO2 26 06/19/2012 1655   GLUCOSE 100* 06/19/2012 1655   BUN 10 06/19/2012 1655   CREATININE 1.0 06/19/2012 1655   CALCIUM 9.2 06/19/2012 1655   PROT 5.4* 08/08/2007 2146   ALBUMIN 2.4* 08/08/2007 2146   AST 27 08/08/2007 2146   ALT 18 08/08/2007 2146   ALKPHOS 85 08/08/2007 2146   BILITOT 0.4 08/08/2007 2146   GFRNONAA >60 10/23/2009 1200   GFRAA  Value: >60        The eGFR has been calculated using the MDRD equation. This calculation has not been validated in all clinical situations. eGFR's persistently <60 mL/min signify possible Chronic Kidney Disease. 10/23/2009 1200   Lab Results  Component Value Date  CHOL 183 11/06/2006   HDL 44.7 11/06/2006   LDLCALC 122* 11/06/2006   TRIG 82 11/06/2006   CHOLHDL 4.1 CALC 11/06/2006    Lab Results  Component Value Date   TSH 2.51 11/06/2006      ASSESSMENT AND PLAN 43 y.o. year old female  has a past medical history of PSVT (paroxysmal supraventricular tachycardia); Gestational diabetes mellitus in pregnancy; Shortness of breath; Kidney stones; and Migraine without aura, without mention of intractable migraine without mention of status migrainosus. here with:  1. Migraine  Patient was educated on how to correctly take Imitrex. She will try this medication again next month with her headache. If this medication is still not beneficial we will consider changing medication. She was informed to call our office with this information. Patient should follow-up in 6 months or sooner if needed.    Ward Givens, MSN, NP-C 02/14/2014, 9:56 AM [facility] [address] [place], [place] [address] [phone]  Note: This document was prepared with digital dictation and possible smart phrase technology. Any transcriptional errors that  result from this process are unintentional.

## 2014-02-16 NOTE — Progress Notes (Signed)
I have read the note, and I agree with the clinical assessment and plan.  Rayan Dyal KEITH   

## 2014-06-02 ENCOUNTER — Encounter: Payer: Self-pay | Admitting: Adult Health

## 2014-06-17 ENCOUNTER — Other Ambulatory Visit (HOSPITAL_COMMUNITY): Payer: Self-pay | Admitting: Obstetrics and Gynecology

## 2014-06-17 DIAGNOSIS — Z1231 Encounter for screening mammogram for malignant neoplasm of breast: Secondary | ICD-10-CM

## 2014-06-25 ENCOUNTER — Ambulatory Visit (HOSPITAL_COMMUNITY)
Admission: RE | Admit: 2014-06-25 | Discharge: 2014-06-25 | Disposition: A | Discharge status: Home / Self Care | Payer: BC Managed Care – PPO | Source: Ambulatory Visit | Attending: Obstetrics and Gynecology | Admitting: Obstetrics and Gynecology

## 2014-06-25 DIAGNOSIS — R928 Other abnormal and inconclusive findings on diagnostic imaging of breast: Secondary | ICD-10-CM | POA: Diagnosis not present

## 2014-06-25 DIAGNOSIS — Z1231 Encounter for screening mammogram for malignant neoplasm of breast: Secondary | ICD-10-CM | POA: Insufficient documentation

## 2014-07-01 ENCOUNTER — Other Ambulatory Visit: Payer: Self-pay | Admitting: Obstetrics and Gynecology

## 2014-07-01 DIAGNOSIS — R928 Other abnormal and inconclusive findings on diagnostic imaging of breast: Secondary | ICD-10-CM

## 2014-07-03 ENCOUNTER — Ambulatory Visit
Admission: RE | Admit: 2014-07-03 | Discharge: 2014-07-03 | Disposition: A | Discharge status: Home / Self Care | Payer: BC Managed Care – PPO | Source: Ambulatory Visit | Attending: Obstetrics and Gynecology | Admitting: Obstetrics and Gynecology

## 2014-07-03 DIAGNOSIS — R928 Other abnormal and inconclusive findings on diagnostic imaging of breast: Secondary | ICD-10-CM

## 2014-07-10 ENCOUNTER — Encounter (HOSPITAL_COMMUNITY): Payer: Self-pay | Admitting: Internal Medicine

## 2014-07-15 ENCOUNTER — Other Ambulatory Visit: Payer: BC Managed Care – PPO

## 2014-08-16 ENCOUNTER — Ambulatory Visit (INDEPENDENT_AMBULATORY_CARE_PROVIDER_SITE_OTHER): Payer: BC Managed Care – PPO

## 2014-08-16 ENCOUNTER — Ambulatory Visit (INDEPENDENT_AMBULATORY_CARE_PROVIDER_SITE_OTHER): Payer: BC Managed Care – PPO | Admitting: Internal Medicine

## 2014-08-16 ENCOUNTER — Other Ambulatory Visit: Payer: Self-pay | Admitting: Physician Assistant

## 2014-08-16 VITALS — BP 138/82 | HR 113 | Temp 99.6°F | Resp 21 | Ht 61.0 in | Wt 155.0 lb

## 2014-08-16 DIAGNOSIS — R509 Fever, unspecified: Secondary | ICD-10-CM

## 2014-08-16 DIAGNOSIS — R059 Cough, unspecified: Secondary | ICD-10-CM

## 2014-08-16 DIAGNOSIS — R630 Anorexia: Secondary | ICD-10-CM

## 2014-08-16 DIAGNOSIS — R05 Cough: Secondary | ICD-10-CM

## 2014-08-16 DIAGNOSIS — J189 Pneumonia, unspecified organism: Secondary | ICD-10-CM

## 2014-08-16 LAB — POCT CBC
Granulocyte percent: 71.5 %G (ref 37–80)
HCT, POC: 38.2 % (ref 37.7–47.9)
HEMOGLOBIN: 12.4 g/dL (ref 12.2–16.2)
Lymph, poc: 1.6 (ref 0.6–3.4)
MCH: 29.2 pg (ref 27–31.2)
MCHC: 32.4 g/dL (ref 31.8–35.4)
MCV: 89.9 fL (ref 80–97)
MID (cbc): 6.3 — AB (ref 0–0.9)
MPV: 8.1 fL (ref 0–99.8)
PLATELET COUNT, POC: 270 10*3/uL (ref 142–424)
POC GRANULOCYTE: 6.3 (ref 2–6.9)
POC LYMPH PERCENT: 18.2 %L (ref 10–50)
POC MID %: 10.3 %M (ref 0–12)
RBC: 4.62 M/uL (ref 4.04–5.48)
RDW, POC: 13.3 %
WBC: 8.8 10*3/uL (ref 4.6–10.2)

## 2014-08-16 LAB — POCT URINALYSIS DIPSTICK
GLUCOSE UA: NEGATIVE
Leukocytes, UA: NEGATIVE
NITRITE UA: NEGATIVE
Protein, UA: 30
RBC UA: NEGATIVE
UROBILINOGEN UA: 2
pH, UA: 6

## 2014-08-16 MED ORDER — AZITHROMYCIN 500 MG PO TABS: 500.0000 mg | ORAL_TABLET | Freq: Every day | ORAL | Status: AC

## 2014-08-16 MED ORDER — HYDROCODONE-HOMATROPINE 5-1.5 MG/5ML PO SYRP: 5.0000 mL | ORAL_SOLUTION | Freq: Three times a day (TID) | ORAL | Status: DC | PRN

## 2014-08-16 MED ORDER — IBUPROFEN 600 MG PO TABS: 600.0000 mg | ORAL_TABLET | Freq: Three times a day (TID) | ORAL | Status: AC | PRN

## 2014-08-16 NOTE — Progress Notes (Signed)
IDENTIFYING INFORMATION  [name] / DOB: 1971-03-10 / MRN: [id number]  The patient has Hernia and Migraine without aura, without mention of intractable migraine without mention of status migrainosus on her problem list.  SUBJECTIVE  CC: Cough; Fever; Generalized Body Aches; and Sore Throat   HPI: [name] is a 44 y.o. well appearing female presenting for cold like symptoms that started a week ago with cough, myalgia, and HA.  The next day she reports a fever of 100, for which she tried Advil with good relief.  The next day her fever was 102, and had a more difficult time controlling her symptoms.  The following day she lost her voice and the fever continued.  She has tried multiple OTCs with mild relief. She has not had the seasonal flu shot.  She denies dizziness upon standing.  She reports having no appetite.   She denies a history of kidney, PUD, and HTN.   She  has a past medical history of PSVT (paroxysmal supraventricular tachycardia); Gestational diabetes mellitus in pregnancy; Shortness of breath; Kidney stones; and Migraine without aura, without mention of intractable migraine without mention of status migrainosus.    She has a current medication list which includes the following prescription(s): azithromycin, calcium carbonate, hydrocodone-homatropine, ibuprofen, isometheptene-acetaminophen-dichloralphenazone, and multivitamin.  [name] has No Known Allergies. She  reports that she has never smoked. She has never used smokeless tobacco. She reports that she does not drink alcohol or use illicit drugs. She  reports that she currently engages in sexual activity. She reports using the following method of birth control/protection: None.  The patient  has past surgical history that includes Cesarean section (2002; 2009); Wisdom tooth extraction (? 1990's); Inguinal hernia repair (2011); Supraventricular tachycardia ablation (06/25/2012); and supraventricular tachycardia  ablation (N/A, 06/25/2012).  Her family history includes Cancer in her mother; Diabetes in her father; Emphysema in her paternal grandfather and sister; Heart disease in her father, maternal grandfather, and paternal grandfather; Hypertension in her father; Ulcerative colitis in her sister. There is no history of Migraines.  Review of Systems  Constitutional: Positive for fever, malaise/fatigue and diaphoresis (night time). Negative for chills.  HENT: Positive for sore throat. Negative for congestion.   Respiratory: Positive for cough. Negative for sputum production, shortness of breath and wheezing.   Cardiovascular: Negative for chest pain.  Musculoskeletal: Positive for myalgias.  Neurological: Positive for headaches.    OBJECTIVE  Blood pressure 138/82, pulse 113, temperature 99.6 F (37.6 C), temperature source Oral, resp. rate 21, height  (1.549 m), weight 155 lb (70.308 kg), last menstrual period 07/22/2014, SpO2 96 %. The patient's body mass index is 29.3 kg/(m^2).  Physical Exam  Vitals reviewed. Constitutional: She is oriented to person, place, and time. She appears well-developed.  HENT:  Right Ear: Hearing, tympanic membrane, external ear and ear canal normal.  Left Ear: Hearing, tympanic membrane, external ear and ear canal normal.  Nose: Nose normal.  Mouth/Throat: Uvula is midline, oropharynx is clear and moist and mucous membranes are normal.  Cardiovascular: S1 normal and S2 normal.   No extrasystoles are present. Tachycardia present.   Respiratory: Breath sounds normal. No accessory muscle usage. Tachypnea noted. No respiratory distress.  Neurological: She is alert and oriented to person, place, and time. No cranial nerve deficit.  Skin: She is not diaphoretic.    Results for orders placed or performed in visit on 08/16/14 (from the past 24 hour(s))  POCT CBC     Status:  Abnormal   Collection Time: 08/16/14  9:49 AM  Result Value Ref Range   WBC 8.8 4.6 - 10.2  K/uL   Lymph, poc 1.6 0.6 - 3.4   POC LYMPH PERCENT 18.2 10 - 50 %L   MID (cbc) 6.3 (A) 0 - 0.9   POC MID % 10.3 0 - 12 %M   POC Granulocyte 6.3 2 - 6.9   Granulocyte percent 71.5 37 - 80 %G   RBC 4.62 4.04 - 5.48 M/uL   Hemoglobin 12.4 12.2 - 16.2 g/dL   HCT, POC 91.438.2 78.237.7 - 47.9 %   MCV 89.9 80 - 97 fL   MCH, POC 29.2 27 - 31.2 pg   MCHC 32.4 31.8 - 35.4 g/dL   RDW, POC 95.613.3 %   Platelet Count, POC 270 142 - 424 K/uL   MPV 8.1 0 - 99.8 fL  POCT urinalysis dipstick     Status: None   Collection Time: 08/16/14  9:52 AM  Result Value Ref Range   Color, UA amber    Clarity, UA clear    Glucose, UA neg    Bilirubin, UA small    Ketones, UA trace    Spec Grav, UA >=1.030    Blood, UA neg    pH, UA 6.0    Protein, UA 30    Urobilinogen, UA 2.0    Nitrite, UA neg    Leukocytes, UA Negative    [facility] reading (PRIMARY) by  Dr. Perrin MalteseGuest: Possible right middle lobe infiltrate.    ASSESSMENT & PLAN  [name] was seen today for cough, fever, generalized body aches and sore throat.  Diagnoses and associated orders for this visit:  Cough: Patients HPI consistent with flu.  This has likely resolved given duration of symptoms, and she now has an bacterial infiltrate as a sequelae.  Will cover for atypical organisms.  Patient will come back if symptoms worsen, at which point pneumonia coverage should extend to cover Staph species. Update at 3:37 PM on 08/16/2014:  Radiologist read negative for pneumonia.  Will continue current treatment plan in light of compelling HPI and lag time of radiography to correlate with clinical findings.   - POCT CBC - HYDROcodone-homatropine (HYCODAN) 5-1.5 MG/5ML syrup; Take 5 mLs by mouth every 8 (eight) hours as needed for cough.  Fever and chills - POCT CBC - Cancel: DG Chest 1 View; Future - ibuprofen (ADVIL,MOTRIN) 600 MG tablet; Take 1 tablet (600 mg total) by mouth every 8 (eight) hours as needed for fever or headache.  Loss of appetite - POCT urinalysis  dipstick  Pneumonia, organism unspecified - azithromycin (ZITHROMAX) 500 MG tablet; Take 1 tablet (500 mg total) by mouth daily.     The patient was instructed to to call or comeback to clinic as needed, or should symptoms warrant.  Deliah BostonMichael Clark, MHS, PA-C [facility] 08/16/2014 10:25 AM

## 2014-08-18 ENCOUNTER — Ambulatory Visit: Payer: BC Managed Care – PPO | Admitting: Adult Health

## 2015-06-29 ENCOUNTER — Encounter: Payer: Self-pay | Admitting: Internal Medicine

## 2015-10-16 ENCOUNTER — Ambulatory Visit (INDEPENDENT_AMBULATORY_CARE_PROVIDER_SITE_OTHER): Payer: BC Managed Care – PPO | Admitting: Physician Assistant

## 2015-10-16 VITALS — BP 122/72 | HR 110 | Temp 98.5°F | Resp 17 | Ht 60.5 in | Wt 152.0 lb

## 2015-10-16 DIAGNOSIS — R109 Unspecified abdominal pain: Secondary | ICD-10-CM | POA: Diagnosis not present

## 2015-10-16 DIAGNOSIS — N200 Calculus of kidney: Secondary | ICD-10-CM

## 2015-10-16 LAB — POC MICROSCOPIC URINALYSIS (UMFC): MUCUS RE: ABSENT

## 2015-10-16 LAB — POCT URINALYSIS DIP (MANUAL ENTRY)
BILIRUBIN UA: NEGATIVE
BILIRUBIN UA: NEGATIVE
Glucose, UA: NEGATIVE
Leukocytes, UA: NEGATIVE
Nitrite, UA: NEGATIVE
PROTEIN UA: NEGATIVE
SPEC GRAV UA: 1.015
Urobilinogen, UA: 0.2
pH, UA: 7

## 2015-10-16 MED ORDER — TAMSULOSIN HCL 0.4 MG PO CAPS: 0.4000 mg | ORAL_CAPSULE | Freq: Every day | ORAL | Status: DC

## 2015-10-16 MED ORDER — HYDROCODONE-ACETAMINOPHEN 5-325 MG PO TABS
1.0000 | ORAL_TABLET | Freq: Four times a day (QID) | ORAL | Status: DC | PRN
Start: 2015-10-16 — End: 2015-11-05

## 2015-10-16 MED ORDER — ONDANSETRON HCL 8 MG PO TABS: 8.0000 mg | ORAL_TABLET | Freq: Three times a day (TID) | ORAL | Status: DC | PRN

## 2015-10-16 NOTE — Progress Notes (Signed)
Subjective:     Patient ID: [name], female   DOB: 02-01-1971, 45 y.o.   MRN: [id number]  HPI The patient is a 45 year old white female who with PMH ofhx of kidney stones, plantar fascitis, DM, migraines, pre-menopausal and paroxysmal PSVTs who presents with acute onset of flank pain, nausea and vomiting which started this morning. Pain 10/10 started at 6am woke patient up from sleep, intolerable. She has nausea with 4 episodes of emesis and inability to articulate due to pain severity. Her pain decreased around 7:15 am, she rates it to 3/10. She tried heating pads and increased liquids. Took her girlfriends flexeril which provided some relief.  Of note, the patient as a past history of several kidney stones which she has passed previously, all the stones that have been tested are calcium oxalate. Denies fever, chills, sweats, body aches.   The patient currently takes a multivitamin and calcium carbonate  250mg  BID.   Review of Systems  All pertinent ROS as above in HPI    Objective:   Physical Exam  Constitutional: She appears well-developed and well-nourished. No distress.  HENT:  Head: Normocephalic and atraumatic.  Cardiovascular: Normal rate, normal heart sounds and intact distal pulses.  Exam reveals no gallop.   No murmur heard. Mild tachycardia  Pulmonary/Chest: Breath sounds normal. No respiratory distress. She has no wheezes. She has no rales.  Abdominal: Bowel sounds are normal. She exhibits no distension. There is no tenderness. There is no rebound and no guarding.  Positive right sided CVA tenderness.  Skin: Skin is warm and dry.        Assessment:     The patient is a 45 year old female who presents with acute onset of flank pain, nausea and vomiting. She has a past history of kidney stones and states this feels similar yet more painful than previous times. Urine positive for moderate RBC, will treat for symptomatic relief of kidney stones and encourage patient to  f/u in 3-4 days if the stone does not pass.     Plan:     1. Right flank pain - POCT urinalysis dipstick - POCT Microscopic Urinalysis ([facility])  2. Kidney stone - HYDROcodone-acetaminophen (NORCO/VICODIN) 5-325 MG tablet; Take 1 tablet by mouth every 6 (six) hours as needed for moderate pain.  Dispense: 20 tablet; Refill: 0 - tamsulosin (FLOMAX) 0.4 MG CAPS capsule; Take 1 capsule (0.4 mg total) by mouth daily.  Dispense: 14 capsule; Refill: 0 - ondansetron (ZOFRAN) 8 MG tablet; Take 1 tablet (8 mg total) by mouth every 8 (eight) hours as needed for nausea or vomiting.  Dispense: 20 tablet; Refill: 0 - Encouraged patient to increase fluid intake  - Discussed with the patient to return in 3-4 days if the stone does not pass or symptoms do not resolve - Strainer given to patient at the end of encounter

## 2015-10-16 NOTE — Progress Notes (Signed)
   [name]  MRN: [id number] DOB: 05-05-1971  Subjective:  The patient is a 45 year old white female who with PMH ofhx of kidney stones, plantar fascitis, DM, migraines, pre-menopausal and paroxysmal PSVTs who presents with acute onset of flank pain, nausea and vomiting which started this morning. Pain 10/10 started at 6am woke patient up from sleep, intolerable. She has nausea with 4 episodes of emesis and inability to articulate due to pain severity. Her pain decreased around 7:15 am, she rates it to 3/10. She tried heating pads and increased liquids. Took her girlfriends flexeril which provided some relief.  Of note, the patient as a past history of several kidney stones which she has passed previously, all the stones that have been tested are calcium oxalate.  She has always passed all the stone and never needed intervention. Denies fever, chills, sweats, body aches.  The patient currently takes a multivitamin and calcium carbonate 250mg  BID.   Patient Active Problem List   Diagnosis Date Noted  . Migraine without aura, without mention of intractable migraine without mention of status migrainosus 10/15/2013  . Hernia     Current Outpatient Prescriptions on File Prior to Visit  Medication Sig Dispense Refill  . calcium carbonate 200 MG capsule Take 250 mg by mouth 2 (two) times daily with a meal.    . ibuprofen (ADVIL,MOTRIN) 600 MG tablet Take 1 tablet (600 mg total) by mouth every 8 (eight) hours as needed for fever or headache. 30 tablet 0  . Multiple Vitamin (MULTIVITAMIN) capsule Take 1 capsule by mouth daily.     No current facility-administered medications on file prior to visit.    No Known Allergies  Review of Systems Objective:  BP 122/72 mmHg  Pulse 110  Temp(Src) 98.5 F (36.9 C) (Oral)  Resp 17  Ht 5' 0.5" (1.537 m)  Wt 152 lb (68.947 kg)  BMI 29.19 kg/m2  SpO2 98%  LMP 09/21/2015  Physical Exam  Constitutional: She is oriented to person, place, and time  and well-developed, well-nourished, and in no distress.  HENT:  Head: Normocephalic and atraumatic.  Right Ear: Hearing and external ear normal.  Left Ear: Hearing and external ear normal.  Eyes: Conjunctivae are normal.  Neck: Normal range of motion.  Cardiovascular: Normal rate, regular rhythm and normal heart sounds.   No murmur heard. Pulmonary/Chest: Effort normal and breath sounds normal. She has no wheezes.  Abdominal: There is CVA tenderness (right sided).  Neurological: She is alert and oriented to person, place, and time. Gait normal.  Skin: Skin is warm and dry.  Psychiatric: Mood, memory, affect and judgment normal.  Vitals reviewed.   Assessment and Plan :  Right flank pain - Plan: POCT urinalysis dipstick, POCT Microscopic Urinalysis ([facility])  Kidney stone - Plan: HYDROcodone-acetaminophen (NORCO/VICODIN) 5-325 MG tablet, tamsulosin (FLOMAX) 0.4 MG CAPS capsule, ondansetron (ZOFRAN) 8 MG tablet  Pt educated if she does not pass the stones in 3 days - she should call and let me know because she may need a scan.  Benny LennertSarah Weber PA-C  [facility] 10/18/2015 7:15 AM

## 2015-10-26 ENCOUNTER — Other Ambulatory Visit: Payer: Self-pay | Admitting: Physician Assistant

## 2015-10-26 NOTE — H&P (Signed)
This is a pleasant 45 year-old female who presents to our clinic today with left heel pain.  [name] has a history of bilateral heel plantar fasciitis.  She had both sides injected back in October of 2015.  Right is still doing well.  Left pain returned approximately six months ago.  This has progressively worsened.  All of the pain is on the bottom of her heel.  Worse with getting out of bed in the morning, as well as getting up after sitting for long periods of time.  She has taken Advil on an as needed basis which is really not helping much.   Past medical, social and family history reviewed in detail on the patient questionnaire and signed.  Review of systems: As detailed in HPI.  All others reviewed and are negative.   EXAMINATION: Well-developed, well-nourished female in no acute distress.  Alert and oriented x 3.  Lungs clear to auscultation bilaterally.  Heart sounds normal.  Examination of her left heel reveals marked tenderness at the plantar fascial insertion.  She can dorsiflex to neutral with her leg straight.  She is neurovascularly intact distally.    X-RAYS: X-rays reveal a plantar fascia spur.  IMPRESSION: Left heel plantar fasciitis.    PLAN: Today we are proceeding with a 1:2 injection into the plantar fascia on the left.  We are also giving her exercises and have encouraged her to continue these once the injection resolves the pain.  Is going to come down to an endoscopic plantar fasciotomy.  Risks, benefits and possible complications reviewed.  Rehab and recovery times discussed. All questions answered.  Paperwork complete. PROCEDURE NOTE: The patient's clinical condition is marked by substantial pain and/or significant functional disability.  Other conservative therapy has not provided relief, is contraindicated, or not appropriate.  There is a reasonable likelihood that injection will significantly improve the patient's pain and/or functional disability. Patient is seated on the  exam table.  The left heel is prepped with Betadine and alcohol and injected into the plantar fascia with 40 mg of Depo-Medrol and 2 cc of Marcaine.  Patient tolerated the procedure without difficulty.   Loreta Aveaniel F. Murphy, M.D.

## 2015-10-29 ENCOUNTER — Encounter (HOSPITAL_BASED_OUTPATIENT_CLINIC_OR_DEPARTMENT_OTHER): Payer: Self-pay | Admitting: *Deleted

## 2015-10-30 ENCOUNTER — Encounter (HOSPITAL_BASED_OUTPATIENT_CLINIC_OR_DEPARTMENT_OTHER)
Admission: RE | Admit: 2015-10-30 | Discharge: 2015-10-30 | Disposition: A | Discharge status: Home / Self Care | Payer: BC Managed Care – PPO | Source: Ambulatory Visit | Attending: Orthopedic Surgery | Admitting: Orthopedic Surgery

## 2015-10-30 DIAGNOSIS — Z791 Long term (current) use of non-steroidal anti-inflammatories (NSAID): Secondary | ICD-10-CM | POA: Diagnosis not present

## 2015-10-30 DIAGNOSIS — M722 Plantar fascial fibromatosis: Secondary | ICD-10-CM | POA: Diagnosis not present

## 2015-10-30 DIAGNOSIS — Z01810 Encounter for preprocedural cardiovascular examination: Secondary | ICD-10-CM | POA: Diagnosis present

## 2015-10-30 NOTE — Progress Notes (Signed)
Ekg cleared by Dr Leta JunglingEwell

## 2015-11-04 NOTE — Anesthesia Preprocedure Evaluation (Addendum)
Anesthesia Evaluation  Patient identified by MRN, date of birth, ID band Patient awake    Reviewed: Allergy & Precautions, NPO status , Patient's Chart, lab work & pertinent test results  History of Anesthesia Complications Negative for: history of anesthetic complications  Airway Mallampati: II  TM Distance: >3 FB Neck ROM: Full    Dental  (+) Teeth Intact, Dental Advisory Given   Pulmonary neg pulmonary ROS,    Pulmonary exam normal        Cardiovascular Normal cardiovascular exam+ dysrhythmias Supra Ventricular Tachycardia      Neuro/Psych  Headaches,    GI/Hepatic negative GI ROS, Neg liver ROS,   Endo/Other  negative endocrine ROS  Renal/GU negative Renal ROS     Musculoskeletal   Abdominal   Peds  Hematology negative hematology ROS (+)   Anesthesia Other Findings   Reproductive/Obstetrics                            Anesthesia Physical Anesthesia Plan  ASA: II  Anesthesia Plan: General   Post-op Pain Management:    Induction: Intravenous  Airway Management Planned: LMA  Additional Equipment:   Intra-op Plan:   Post-operative Plan: Extubation in OR  Informed Consent: I have reviewed the patients History and Physical, chart, labs and discussed the procedure including the risks, benefits and alternatives for the proposed anesthesia with the patient or authorized representative who has indicated his/her understanding and acceptance.   Dental advisory given  Plan Discussed with: Anesthesiologist  Anesthesia Plan Comments:        Anesthesia Quick Evaluation

## 2015-11-05 ENCOUNTER — Encounter (HOSPITAL_BASED_OUTPATIENT_CLINIC_OR_DEPARTMENT_OTHER)
Admission: RE | Disposition: A | Discharge status: Home / Self Care | Payer: Self-pay | Source: Ambulatory Visit | Attending: Orthopedic Surgery

## 2015-11-05 ENCOUNTER — Ambulatory Visit (HOSPITAL_BASED_OUTPATIENT_CLINIC_OR_DEPARTMENT_OTHER)
Admission: RE | Admit: 2015-11-05 | Discharge: 2015-11-05 | Disposition: A | Discharge status: Home / Self Care | Payer: BC Managed Care – PPO | Source: Ambulatory Visit | Attending: Orthopedic Surgery | Admitting: Orthopedic Surgery

## 2015-11-05 ENCOUNTER — Ambulatory Visit (HOSPITAL_BASED_OUTPATIENT_CLINIC_OR_DEPARTMENT_OTHER): Payer: BC Managed Care – PPO | Admitting: Anesthesiology

## 2015-11-05 ENCOUNTER — Telehealth: Payer: Self-pay | Admitting: Physician Assistant

## 2015-11-05 ENCOUNTER — Encounter (HOSPITAL_BASED_OUTPATIENT_CLINIC_OR_DEPARTMENT_OTHER): Payer: Self-pay | Admitting: *Deleted

## 2015-11-05 DIAGNOSIS — Z791 Long term (current) use of non-steroidal anti-inflammatories (NSAID): Secondary | ICD-10-CM | POA: Insufficient documentation

## 2015-11-05 DIAGNOSIS — M722 Plantar fascial fibromatosis: Secondary | ICD-10-CM | POA: Diagnosis not present

## 2015-11-05 HISTORY — PX: PLANTAR FASCIA RELEASE: SHX2239

## 2015-11-05 SURGERY — FASCIOTOMY, PLANTAR, ENDOSCOPIC
Anesthesia: General | Site: Foot | Laterality: Left

## 2015-11-05 MED ORDER — SCOPOLAMINE 1 MG/3DAYS TD PT72
MEDICATED_PATCH | TRANSDERMAL | Status: AC
  Filled 2015-11-05: qty 1

## 2015-11-05 MED ORDER — DEXAMETHASONE SODIUM PHOSPHATE 10 MG/ML IJ SOLN
INTRAMUSCULAR | Status: DC | PRN
  Administered 2015-11-05: 10 mg via INTRAVENOUS

## 2015-11-05 MED ORDER — LIDOCAINE HCL (CARDIAC) 20 MG/ML IV SOLN
INTRAVENOUS | Status: DC | PRN
  Administered 2015-11-05: 80 mg via INTRAVENOUS

## 2015-11-05 MED ORDER — PROMETHAZINE HCL 25 MG/ML IJ SOLN
INTRAMUSCULAR | Status: AC
  Filled 2015-11-05: qty 1

## 2015-11-05 MED ORDER — GLYCOPYRROLATE 0.2 MG/ML IJ SOLN: 0.2000 mg | Freq: Once | INTRAMUSCULAR | Status: DC | PRN

## 2015-11-05 MED ORDER — HYDROMORPHONE HCL 1 MG/ML IJ SOLN
INTRAMUSCULAR | Status: AC
  Filled 2015-11-05: qty 1

## 2015-11-05 MED ORDER — PROPOFOL 10 MG/ML IV BOLUS
INTRAVENOUS | Status: DC | PRN
  Administered 2015-11-05: 200 mg via INTRAVENOUS

## 2015-11-05 MED ORDER — DEXAMETHASONE SODIUM PHOSPHATE 10 MG/ML IJ SOLN
INTRAMUSCULAR | Status: AC
  Filled 2015-11-05: qty 1

## 2015-11-05 MED ORDER — CEFAZOLIN SODIUM-DEXTROSE 2-4 GM/100ML-% IV SOLN
INTRAVENOUS | Status: AC
  Filled 2015-11-05: qty 100

## 2015-11-05 MED ORDER — SUCCINYLCHOLINE CHLORIDE 20 MG/ML IJ SOLN
INTRAMUSCULAR | Status: AC
  Filled 2015-11-05: qty 1

## 2015-11-05 MED ORDER — MIDAZOLAM HCL 2 MG/2ML IJ SOLN
1.0000 mg | INTRAMUSCULAR | Status: DC | PRN
  Administered 2015-11-05: 2 mg via INTRAVENOUS

## 2015-11-05 MED ORDER — MIDAZOLAM HCL 2 MG/2ML IJ SOLN
INTRAMUSCULAR | Status: AC
  Filled 2015-11-05: qty 2

## 2015-11-05 MED ORDER — ONDANSETRON HCL 4 MG/2ML IJ SOLN
INTRAMUSCULAR | Status: DC | PRN
  Administered 2015-11-05: 4 mg via INTRAVENOUS

## 2015-11-05 MED ORDER — SCOPOLAMINE 1 MG/3DAYS TD PT72: 1.0000 | MEDICATED_PATCH | TRANSDERMAL | Status: DC

## 2015-11-05 MED ORDER — HYDROMORPHONE HCL 1 MG/ML IJ SOLN
0.2500 mg | INTRAMUSCULAR | Status: DC | PRN
  Administered 2015-11-05 (×3): 0.5 mg via INTRAVENOUS

## 2015-11-05 MED ORDER — LIDOCAINE HCL (CARDIAC) 20 MG/ML IV SOLN
INTRAVENOUS | Status: AC
  Filled 2015-11-05: qty 5

## 2015-11-05 MED ORDER — PROPOFOL 500 MG/50ML IV EMUL
INTRAVENOUS | Status: AC
  Filled 2015-11-05: qty 50

## 2015-11-05 MED ORDER — ONDANSETRON HCL 4 MG/2ML IJ SOLN
INTRAMUSCULAR | Status: AC
  Filled 2015-11-05: qty 2

## 2015-11-05 MED ORDER — FENTANYL CITRATE (PF) 100 MCG/2ML IJ SOLN
INTRAMUSCULAR | Status: AC
  Filled 2015-11-05: qty 2

## 2015-11-05 MED ORDER — PROMETHAZINE HCL 25 MG/ML IJ SOLN
6.2500 mg | INTRAMUSCULAR | Status: DC | PRN
  Administered 2015-11-05: 6.25 mg via INTRAVENOUS

## 2015-11-05 MED ORDER — BUPIVACAINE HCL (PF) 0.5 % IJ SOLN
INTRAMUSCULAR | Status: AC
  Filled 2015-11-05: qty 30

## 2015-11-05 MED ORDER — OXYCODONE-ACETAMINOPHEN 5-325 MG PO TABS: 1.0000 | ORAL_TABLET | ORAL | Status: DC | PRN

## 2015-11-05 MED ORDER — ONDANSETRON HCL 4 MG PO TABS: 4.0000 mg | ORAL_TABLET | Freq: Three times a day (TID) | ORAL | Status: AC | PRN

## 2015-11-05 MED ORDER — SCOPOLAMINE 1 MG/3DAYS TD PT72
1.0000 | MEDICATED_PATCH | Freq: Once | TRANSDERMAL | Status: DC | PRN
  Administered 2015-11-05: 1.5 mg via TRANSDERMAL

## 2015-11-05 MED ORDER — ATROPINE SULFATE 0.4 MG/ML IJ SOLN
INTRAMUSCULAR | Status: AC
  Filled 2015-11-05: qty 1

## 2015-11-05 MED ORDER — LACTATED RINGERS IV SOLN
INTRAVENOUS | Status: DC
  Administered 2015-11-05: 10:00:00 via INTRAVENOUS
  Administered 2015-11-05: 10 mL/h via INTRAVENOUS

## 2015-11-05 MED ORDER — CEFAZOLIN SODIUM-DEXTROSE 2-4 GM/100ML-% IV SOLN
2.0000 g | INTRAVENOUS | Status: AC
  Administered 2015-11-05: 2 g via INTRAVENOUS

## 2015-11-05 MED ORDER — LACTATED RINGERS IV SOLN
INTRAVENOUS | Status: DC
  Administered 2015-11-05: 07:00:00 via INTRAVENOUS

## 2015-11-05 MED ORDER — CHLORHEXIDINE GLUCONATE 4 % EX LIQD: 60.0000 mL | Freq: Once | CUTANEOUS | Status: DC

## 2015-11-05 MED ORDER — SODIUM CHLORIDE 0.9 % IJ SOLN
INTRAMUSCULAR | Status: AC
  Filled 2015-11-05: qty 10

## 2015-11-05 MED ORDER — FENTANYL CITRATE (PF) 100 MCG/2ML IJ SOLN
50.0000 ug | INTRAMUSCULAR | Status: DC | PRN
  Administered 2015-11-05: 50 ug via INTRAVENOUS
  Administered 2015-11-05: 100 ug via INTRAVENOUS

## 2015-11-05 MED ORDER — BUPIVACAINE HCL (PF) 0.5 % IJ SOLN
INTRAMUSCULAR | Status: DC | PRN
  Administered 2015-11-05: 10 mL

## 2015-11-05 SURGICAL SUPPLY — 48 items
APPLICATOR COTTON TIP 6IN STRL (MISCELLANEOUS) IMPLANT
BANDAGE ACE 3X5.8 VEL STRL LF (GAUZE/BANDAGES/DRESSINGS) ×2 IMPLANT
BANDAGE ACE 4X5 VEL STRL LF (GAUZE/BANDAGES/DRESSINGS) IMPLANT
BANDAGE ESMARK 6X9 LF (GAUZE/BANDAGES/DRESSINGS) ×1 IMPLANT
BLADE CUTTER GATOR 3.5 (BLADE) ×2 IMPLANT
BLADE GREAT WHITE 4.2 (BLADE) IMPLANT
BLADE SURG 15 STRL LF DISP TIS (BLADE) ×1 IMPLANT
BLADE SURG 15 STRL SS (BLADE) ×1
BLADE TRIANGLE EPF/EGR ENDO (BLADE) ×2 IMPLANT
BNDG COHESIVE 4X5 TAN STRL (GAUZE/BANDAGES/DRESSINGS) ×2 IMPLANT
BNDG ESMARK 6X9 LF (GAUZE/BANDAGES/DRESSINGS) ×2
BNDG GAUZE ELAST 4 BULKY (GAUZE/BANDAGES/DRESSINGS) ×2 IMPLANT
BUR CUDA 2.9 (BURR) IMPLANT
BUR GATOR 2.9 (BURR) IMPLANT
CANISTER SUCT 1200ML W/VALVE (MISCELLANEOUS) ×2 IMPLANT
COVER BACK TABLE 60X90IN (DRAPES) ×2 IMPLANT
CUFF TOURNIQUET SINGLE 18IN (TOURNIQUET CUFF) IMPLANT
CUFF TOURNIQUET SINGLE 24IN (TOURNIQUET CUFF) ×2 IMPLANT
CUFF TOURNIQUET SINGLE 34IN LL (TOURNIQUET CUFF) IMPLANT
DECANTER SPIKE VIAL GLASS SM (MISCELLANEOUS) ×2 IMPLANT
DRAPE EXTREMITY T 121X128X90 (DRAPE) ×2 IMPLANT
DRAPE IMP U-DRAPE 54X76 (DRAPES) ×2 IMPLANT
DRAPE OEC MINIVIEW 54X84 (DRAPES) ×2 IMPLANT
DRAPE U-SHAPE 47X51 STRL (DRAPES) ×2 IMPLANT
DURAPREP 26ML APPLICATOR (WOUND CARE) ×2 IMPLANT
GAUZE SPONGE 4X4 12PLY STRL (GAUZE/BANDAGES/DRESSINGS) ×2 IMPLANT
GAUZE XEROFORM 1X8 LF (GAUZE/BANDAGES/DRESSINGS) ×2 IMPLANT
GLOVE BIOGEL PI IND STRL 7.0 (GLOVE) ×3 IMPLANT
GLOVE BIOGEL PI INDICATOR 7.0 (GLOVE) ×3
GLOVE ECLIPSE 6.5 STRL STRAW (GLOVE) ×2 IMPLANT
GLOVE ECLIPSE 7.0 STRL STRAW (GLOVE) ×2 IMPLANT
GLOVE SURG ORTHO 8.0 STRL STRW (GLOVE) ×2 IMPLANT
GOWN STRL REUS W/ TWL LRG LVL3 (GOWN DISPOSABLE) ×1 IMPLANT
GOWN STRL REUS W/ TWL XL LVL3 (GOWN DISPOSABLE) ×2 IMPLANT
GOWN STRL REUS W/TWL LRG LVL3 (GOWN DISPOSABLE) ×1
GOWN STRL REUS W/TWL XL LVL3 (GOWN DISPOSABLE) ×2 IMPLANT
NEEDLE HYPO 22GX1.5 SAFETY (NEEDLE) IMPLANT
NS IRRIG 1000ML POUR BTL (IV SOLUTION) IMPLANT
PACK BASIN DAY SURGERY FS (CUSTOM PROCEDURE TRAY) ×2 IMPLANT
PADDING CAST ABS 3INX4YD NS (CAST SUPPLIES)
PADDING CAST ABS COTTON 3X4 (CAST SUPPLIES) IMPLANT
STOCKINETTE 6  STRL (DRAPES) ×1
STOCKINETTE 6 STRL (DRAPES) ×1 IMPLANT
SUT ETHILON 3 0 PS 1 (SUTURE) ×2 IMPLANT
SYR BULB 3OZ (MISCELLANEOUS) ×2 IMPLANT
SYR CONTROL 10ML LL (SYRINGE) IMPLANT
TUBE CONNECTING 20X1/4 (TUBING) ×2 IMPLANT
UNDERPAD 30X30 (UNDERPADS AND DIAPERS) ×2 IMPLANT

## 2015-11-05 NOTE — Anesthesia Postprocedure Evaluation (Signed)
Anesthesia Post Note  Patient: [name]  Procedure(s) Performed: Procedure(s) (LRB): LEFT ENDOSCOPIC PLANTAR FASCIA RELEASE (Left)  Patient location during evaluation: PACU Anesthesia Type: General Level of consciousness: sedated Pain management: pain level controlled Vital Signs Assessment: post-procedure vital signs reviewed and stable Respiratory status: spontaneous breathing and respiratory function stable Cardiovascular status: stable Anesthetic complications: no    Last Vitals:  Filed Vitals:   11/05/15 0830 11/05/15 0845  BP: 103/72 122/90  Pulse: 98 98  Temp:    Resp: 13 16    Last Pain:  Filed Vitals:   11/05/15 0849  PainSc: Asleep                 Raedyn Klinck DANIEL

## 2015-11-05 NOTE — Telephone Encounter (Signed)
Pt dropped off kidney stone that she finally passed we will send for analysis

## 2015-11-05 NOTE — Addendum Note (Signed)
Addended by: Morrell RiddleWEBER, Jorgen Wolfinger L on: 11/05/2015 05:52 PM   Modules accepted: Orders

## 2015-11-05 NOTE — Transfer of Care (Signed)
Immediate Anesthesia Transfer of Care Note  Patient: Melissa LipaLauren M [name]  Procedure(s) Performed: Procedure(s): LEFT ENDOSCOPIC PLANTAR FASCIA RELEASE (Left)  Patient Location: PACU  Anesthesia Type:General  Level of Consciousness: awake, sedated and responds to stimulation  Airway & Oxygen Therapy: Patient Spontanous Breathing and Patient connected to face mask oxygen  Post-op Assessment: Report given to RN, Post -op Vital signs reviewed and stable and Patient moving all extremities  Post vital signs: Reviewed and stable  Last Vitals:  Filed Vitals:   11/05/15 0652  BP: 126/78  Pulse: 89  Temp: 36.7 C  Resp: 18    Complications: No apparent anesthesia complications

## 2015-11-05 NOTE — Discharge Instructions (Signed)
°  Care After Refer to this sheet in the next few weeks. These discharge instructions provide you with general information on caring for yourself after you leave the hospital. Your caregiver may also give you specific instructions. Your treatment has been planned according to the most current medical practices available, but unavoidable complications sometimes occur. If you have any problems or questions after discharge, please call your caregiver. HOME INSTRUCTIONS Weight  Bearing as tolerated but must be in post-op shoe Only take over-the-counter or prescription medicines for pain, discomfort, or fever as directed by your caregiver.  Eat a well-balanced diet.  Avoid lifting or driving until you are instructed otherwise.  Make an appointment to see your caregiver for stitches (suture) or staple removal as directed.   SEEK MEDICAL CARE IF: You have swelling of your calf or leg.  You develop shortness of breath or chest pain.  You have redness, swelling, or increasing pain in the wound.  There is pus or any unusual drainage coming from the surgical site.  You notice a bad smell coming from the surgical site or dressing.  The surgical site breaks open after sutures or staples have been removed.  There is persistent bleeding from the suture or staple line.  You are getting worse or are not improving.  You have any other questions or concerns.  SEEK IMMEDIATE MEDICAL CARE IF:  You have a fever.  You develop a rash.  You have difficulty breathing.  You develop any reaction or side effects to medicines given.  Your foot motion is decreasing rather than improving.  MAKE SURE YOU:  Understand these instructions.  Will watch your condition.  Will get help right away if you are not doing well or get worse.   Post Anesthesia Home Care Instructions  Activity: Get plenty of rest for the remainder of the day. A responsible adult should stay with you for 24 hours following the procedure.  For the  next 24 hours, DO NOT: -Drive a car -Advertising copywriterperate machinery -Drink alcoholic beverages -Take any medication unless instructed by your physician -Make any legal decisions or sign important papers.  Meals: Start with liquid foods such as gelatin or soup. Progress to regular foods as tolerated. Avoid greasy, spicy, heavy foods. If nausea and/or vomiting occur, drink only clear liquids until the nausea and/or vomiting subsides. Call your physician if vomiting continues.  Special Instructions/Symptoms: Your throat may feel dry or sore from the anesthesia or the breathing tube placed in your throat during surgery. If this causes discomfort, gargle with warm salt water. The discomfort should disappear within 24 hours.  If you had a scopolamine patch placed behind your ear for the management of post- operative nausea and/or vomiting:  1. The medication in the patch is effective for 72 hours, after which it should be removed.  Wrap patch in a tissue and discard in the trash. Wash hands thoroughly with soap and water. 2. You may remove the patch earlier than 72 hours if you experience unpleasant side effects which may include dry mouth, dizziness or visual disturbances. 3. Avoid touching the patch. Wash your hands with soap and water after contact with the patch.

## 2015-11-05 NOTE — Anesthesia Procedure Notes (Signed)
Procedure Name: LMA Insertion Date/Time: 11/05/2015 7:43 AM Performed by: Curly ShoresRAFT, Perel Hauschild W Pre-anesthesia Checklist: Patient identified, Emergency Drugs available, Suction available and Patient being monitored Patient Re-evaluated:Patient Re-evaluated prior to inductionOxygen Delivery Method: Circle System Utilized Preoxygenation: Pre-oxygenation with 100% oxygen Intubation Type: IV induction Ventilation: Mask ventilation without difficulty LMA: LMA inserted LMA Size: 4.0 Number of attempts: 1 Airway Equipment and Method: Bite block Placement Confirmation: positive ETCO2 and breath sounds checked- equal and bilateral Tube secured with: Tape Dental Injury: Teeth and Oropharynx as per pre-operative assessment

## 2015-11-05 NOTE — Interval H&P Note (Signed)
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 After consideration of risks, benefits and other options for treatment, the patient has consented to  Procedure(s): LEFT ENDOSCOPIC PLANTAR FASCIA RELEASE (Left) as a surgical intervention .  The patient's history has been reviewed, patient examined, no change in status, stable for surgery.  I have reviewed the patient's chart and labs.  Questions were answered to the patient's satisfaction.     Loreta Aveaniel F Keelen Quevedo

## 2015-11-06 ENCOUNTER — Encounter (HOSPITAL_BASED_OUTPATIENT_CLINIC_OR_DEPARTMENT_OTHER): Payer: Self-pay | Admitting: Orthopedic Surgery

## 2015-11-06 NOTE — Op Note (Signed)
NA[place] Hurry:  [name], Melissa               ACCOUNT NO.:  [id number]  MEDICAL RECORD NO.:  [id number]  LOCATION:                                 FACILITY:  PHYSICIAN:  Loreta Aveaniel F. Murphy, M.D. DATE OF BIRTH:  Dec 15, 1970  DATE OF PROCEDURE:  11/05/2015 DATE OF DISCHARGE:  11/05/2015                              OPERATIVE REPORT   PREOPERATIVE DIAGNOSIS:  Recalcitrant chronic plantar fasciitis, left heel with detached heel spur.  POSTOPERATIVE DIAGNOSIS:  Recalcitrant chronic plantar fasciitis, left heel with detached heel spur.  PROCEDURE:  Endoscopic plantar fascia release, left foot.  SURGEON:  Loreta Aveaniel F. Murphy, MD  ASSISTANT:  Mikey KirschnerLindsey Stanberry, PA  ANESTHESIA:  General.  BLOOD LOSS:  Minimal.  SPECIMENS:  None.  CULTURES:  None.  COMPLICATIONS:  None.  DRESSINGS:  Soft compressive.  TOURNIQUET TIME:  20 minutes.  PROCEDURE IN DETAIL:  Patient was brought to the operating room and after adequate anesthesia had been obtained, tourniquet applied, prepped and draped in usual sterile fashion.  A standard elevation of Esm[place]. Tourniquet inflated to 250 mmHg.  With fluoroscopic guidance, plantar fascia attachment to the spur __________ identified.  Small stab was made, medial and lateral.  Spreading instrument and then cannula placed on the plantar side of this.  Under direct visualization, plantar fascia was divided in its entirety from medial to lateral.  Confirmed good release taking from both sides.  Wound irrigated, injected with Marcaine.  Portals were closed with nylon.  Sterile compressive dressing applied.  Tourniquet inflated and removed.  Anesthesia reversed. Brought to the recovery room.  Tolerated the surgery well.  No complications.     Loreta Aveaniel F. Murphy, M.D.     DFM/MEDQ  D:  11/05/2015  T:  11/05/2015  Job:  [id number]

## 2015-11-09 LAB — STONE ANALYSIS: Stone Weight KSTONE: 0.01 g

## 2015-11-13 ENCOUNTER — Encounter: Payer: Self-pay | Admitting: Physician Assistant

## 2019-09-12 ENCOUNTER — Telehealth: Payer: Self-pay | Admitting: Cardiovascular Disease

## 2019-09-12 NOTE — Telephone Encounter (Signed)
New Message  [facility] called and they wanted to know if Dr. Eden Emms has completed a cardiac form for [name]

## 2019-09-23 NOTE — Telephone Encounter (Signed)
Tried to call Loraine Leriche back with [employer] at [phone], cas # [id number]. Not sure what this is in referance to. Patient has not been seen by our office since 2013. Will forward to medical records to see if they have any paper work on patient and to follow up.

## 2019-10-30 ENCOUNTER — Telehealth: Payer: Self-pay | Admitting: Internal Medicine

## 2019-10-30 NOTE — Telephone Encounter (Signed)
  [facility] [employer] on behalf of [employer] was requesting records from this patient from 2010-2013. Please fax records to : [phone] ATTN: Case # [id number]

## 2023-07-11 ENCOUNTER — Emergency Department (HOSPITAL_COMMUNITY)
Admission: EM | Admit: 2023-07-11 | Discharge: 2023-07-11 | Disposition: A | Discharge status: Home / Self Care | Payer: BC Managed Care – PPO | Attending: Emergency Medicine | Admitting: Emergency Medicine

## 2023-07-11 ENCOUNTER — Emergency Department (HOSPITAL_COMMUNITY): Payer: BC Managed Care – PPO

## 2023-07-11 ENCOUNTER — Other Ambulatory Visit: Payer: Self-pay

## 2023-07-11 DIAGNOSIS — R109 Unspecified abdominal pain: Secondary | ICD-10-CM | POA: Diagnosis present

## 2023-07-11 DIAGNOSIS — N201 Calculus of ureter: Secondary | ICD-10-CM | POA: Insufficient documentation

## 2023-07-11 LAB — URINALYSIS, ROUTINE W REFLEX MICROSCOPIC
Bacteria, UA: NONE SEEN
Bilirubin Urine: NEGATIVE
Glucose, UA: NEGATIVE mg/dL
Ketones, ur: NEGATIVE mg/dL
Leukocytes,Ua: NEGATIVE
Nitrite: NEGATIVE
Protein, ur: NEGATIVE mg/dL
RBC / HPF: >50 RBC/hpf (ref 0–5)
Specific Gravity, Urine: 1.019 (ref 1.005–1.030)
pH: 6 (ref 5.0–8.0)

## 2023-07-11 LAB — CBC WITH DIFFERENTIAL/PLATELET
Abs Immature Granulocytes: 0.03 10*3/uL (ref 0.00–0.07)
Basophils Absolute: 0.1 10*3/uL (ref 0.0–0.1)
Basophils Relative: 1 %
Eosinophils Absolute: 0.2 10*3/uL (ref 0.0–0.5)
Eosinophils Relative: 2 %
HCT: 45 % (ref 36.0–46.0)
Hemoglobin: 15.1 g/dL — ABNORMAL HIGH (ref 12.0–15.0)
Immature Granulocytes: 0 %
Lymphocytes Relative: 21 %
Lymphs Abs: 1.7 10*3/uL (ref 0.7–4.0)
MCH: 30.2 pg (ref 26.0–34.0)
MCHC: 33.6 g/dL (ref 30.0–36.0)
MCV: 90 fL (ref 80.0–100.0)
Monocytes Absolute: 0.7 10*3/uL (ref 0.1–1.0)
Monocytes Relative: 9 %
Neutro Abs: 5.5 10*3/uL (ref 1.7–7.7)
Neutrophils Relative %: 67 %
Platelets: 267 10*3/uL (ref 150–400)
RBC: 5 MIL/uL (ref 3.87–5.11)
RDW: 13.1 % (ref 11.5–15.5)
WBC: 8.2 10*3/uL (ref 4.0–10.5)
nRBC: 0 % (ref 0.0–0.2)

## 2023-07-11 LAB — COMPREHENSIVE METABOLIC PANEL
ALT: 29 U/L (ref 0–44)
AST: 29 U/L (ref 15–41)
Albumin: 4.4 g/dL (ref 3.5–5.0)
Alkaline Phosphatase: 71 U/L (ref 38–126)
Anion gap: 9 (ref 5–15)
BUN: 14 mg/dL (ref 6–20)
CO2: 24 mmol/L (ref 22–32)
Calcium: 9.5 mg/dL (ref 8.9–10.3)
Chloride: 105 mmol/L (ref 98–111)
Creatinine, Ser: 0.84 mg/dL (ref 0.44–1.00)
GFR, Estimated: >60 mL/min (ref >60)
Glucose, Bld: 113 mg/dL — ABNORMAL HIGH (ref 70–99)
Potassium: 4 mmol/L (ref 3.5–5.1)
Sodium: 138 mmol/L (ref 135–145)
Total Bilirubin: 0.5 mg/dL (ref <1.2)
Total Protein: 7.7 g/dL (ref 6.5–8.1)

## 2023-07-11 MED ORDER — TAMSULOSIN HCL 0.4 MG PO CAPS: 0.4000 mg | ORAL_CAPSULE | Freq: Every day | ORAL | 0 refills | Status: AC

## 2023-07-11 MED ORDER — OXYCODONE-ACETAMINOPHEN 5-325 MG PO TABS
1.0000 | ORAL_TABLET | Freq: Once | ORAL | Status: AC
  Administered 2023-07-11: 1 via ORAL
  Filled 2023-07-11: qty 1

## 2023-07-11 MED ORDER — ONDANSETRON 4 MG PO TBDP: 4.0000 mg | ORAL_TABLET | Freq: Three times a day (TID) | ORAL | 0 refills | Status: AC | PRN

## 2023-07-11 MED ORDER — OXYCODONE-ACETAMINOPHEN 5-325 MG PO TABS: 1.0000 | ORAL_TABLET | ORAL | 0 refills | Status: AC | PRN

## 2023-07-11 MED ORDER — ONDANSETRON 4 MG PO TBDP
4.0000 mg | ORAL_TABLET | Freq: Once | ORAL | Status: AC
  Administered 2023-07-11: 4 mg via ORAL
  Filled 2023-07-11: qty 1

## 2023-07-11 MED ORDER — OXYCODONE-ACETAMINOPHEN 5-325 MG PO TABS
2.0000 | ORAL_TABLET | Freq: Once | ORAL | Status: AC
  Administered 2023-07-11: 2 via ORAL
  Filled 2023-07-11: qty 2

## 2023-07-11 NOTE — ED Notes (Signed)
11:40 PM  Discharge instructions discussed with patient and spouse. Both voice understanding of discharge instructions and denies any questions and concerns regarding discharge. Patient is stable at discharge and in NAD. Prescription and follow up instructions discussed with patient and spouse, and each voice understanding of instructions. Patient spouse to drive patient home.

## 2023-07-11 NOTE — ED Triage Notes (Signed)
Pt has had right flank x 1-2 hours and urinary urgency that feels like previous episodes of kidney stones.

## 2023-07-11 NOTE — Discharge Instructions (Addendum)
Take the prescribed medication as directed.  Can strain urine to monitor for passage of stone.   Can follow-up with urology if needed.  Can call for appt. Return to the ED for new or worsening symptoms.

## 2023-07-11 NOTE — ED Provider Notes (Signed)
[place] EMERGENCY DEPARTMENT AT [facility] Provider Note   CSN: [id number] Arrival date & time: 07/11/23  1840     History  Chief Complaint  Patient presents with   Flank Pain    [name] is a 52 y.o. female.  The history is provided by the patient and medical records.  Flank Pain   52 year old female with history of migraines, kidney stones, presenting to the ED with sudden onset of right flank pain around 5:30 PM while she was driving home from work.  States by the time she got home 15 minutes later pain was intolerable.  She tried using heating pad, taking warm bath, and drinking lots of water which usually works for her kidney stones but continued to feel worse so came in.  She has always been able to pass her stones and has never required intervention by urology.  She denies any fever or chills.  No dysuria.  She was given Percocet in triage and reports no pain by time of my evaluation.  Home Medications Prior to Admission medications   Medication Sig Start Date End Date Taking? Authorizing Provider  calcium carbonate 200 MG capsule Take 250 mg by mouth 2 (two) times daily with a meal.    [provider]  ibuprofen (ADVIL,MOTRIN) 600 MG tablet Take 1 tablet (600 mg total) by mouth every 8 (eight) hours as needed for fever or headache. 08/16/14   Ofilia Neas, PA-C  Multiple Vitamin (MULTIVITAMIN) capsule Take 1 capsule by mouth daily.    [provider]  ondansetron (ZOFRAN) 4 MG tablet Take 1 tablet (4 mg total) by mouth every 8 (eight) hours as needed for nausea or vomiting. 11/05/15   Cristie Hem, PA-C  oxyCODONE-acetaminophen (ROXICET) 5-325 MG tablet Take 1-2 tablets by mouth every 4 (four) hours as needed. 11/05/15   Cristie Hem, PA-C      Allergies    Patient has no known allergies.    Review of Systems   Review of Systems  Genitourinary:  Positive for flank pain.  All other systems reviewed and are  negative.   Physical Exam Updated Vital Signs BP (!) 150/86 (BP Location: Right Arm)   Pulse 82   Temp 97.9 F (36.6 C) (Oral)   Resp 16   Ht 5\' 5"  (1.651 m)   Wt 68.9 kg   LMP  (Exact Date)   SpO2 97%   BMI 25.29 kg/m   Physical Exam Vitals and nursing note reviewed.  Constitutional:      Appearance: She is well-developed.     Comments: Appears comfortable  HENT:     Head: Normocephalic and atraumatic.  Eyes:     Conjunctiva/sclera: Conjunctivae normal.     Pupils: Pupils are equal, round, and reactive to light.  Cardiovascular:     Rate and Rhythm: Normal rate and regular rhythm.     Heart sounds: Normal heart sounds.  Pulmonary:     Effort: Pulmonary effort is normal. No respiratory distress.     Breath sounds: Normal breath sounds. No rhonchi.  Abdominal:     General: Bowel sounds are normal.     Palpations: Abdomen is soft.  Musculoskeletal:        General: Normal range of motion.     Cervical back: Normal range of motion.  Skin:    General: Skin is warm and dry.  Neurological:     Mental Status: She is alert and oriented to person, place, and  time.     ED Results / Procedures / Treatments   Labs (all labs ordered are listed, but only abnormal results are displayed) Labs Reviewed  URINALYSIS, ROUTINE W REFLEX MICROSCOPIC - Abnormal; Notable for the following components:      Result Value   Hgb urine dipstick MODERATE (*)    All other components within normal limits  CBC WITH DIFFERENTIAL/PLATELET - Abnormal; Notable for the following components:   Hemoglobin 15.1 (*)    All other components within normal limits  COMPREHENSIVE METABOLIC PANEL - Abnormal; Notable for the following components:   Glucose, Bld 113 (*)    All other components within normal limits    EKG None  Radiology CT Renal Stone Study  Result Date: 07/11/2023 CLINICAL DATA:  Right flank pain, urolithiasis EXAM: CT ABDOMEN AND PELVIS WITHOUT CONTRAST TECHNIQUE: Multidetector CT  imaging of the abdomen and pelvis was performed following the standard protocol without IV contrast. RADIATION DOSE REDUCTION: This exam was performed according to the departmental dose-optimization program which includes automated exposure control, adjustment of the mA and/or kV according to patient size and/or use of iterative reconstruction technique. COMPARISON:  08/07/2008 FINDINGS: Lower chest: No pleural or pericardial effusion. Visualized lung bases clear. Hepatobiliary: No focal liver abnormality is seen. No gallstones, gallbladder wall thickening, or biliary dilatation. Pancreas: Unremarkable. No pancreatic ductal dilatation or surrounding inflammatory changes. Spleen: Normal in size without focal abnormality. Adrenals/Urinary Tract: No adrenal mass. 2 mm calculus in the lower right renal collecting system. No hydronephrosis or ureterectasis. 3 mm calculus near the right ureteral orifice (Im55,Se7) . urinary bladder is nondistended. Stomach/Bowel: : Partially distended, without acute finding. Small bowel nondilated. Normal appendix. The colon is partially distended, without acute finding. Vascular/Lymphatic: No significant vascular findings are present. No enlarged abdominal or pelvic lymph nodes. Reproductive: Uterus and bilateral adnexa are unremarkable. Other: No ascites.  No free air. Musculoskeletal: No acute or significant osseous findings. IMPRESSION: 1. 3 mm calculus near the right ureteral orifice, without hydronephrosis. 2. 2 mm right renal calculus. Electronically Signed   By: Corlis Leak M.D.   On: 07/11/2023 20:25    Procedures Procedures    Medications Ordered in ED Medications  oxyCODONE-acetaminophen (PERCOCET/ROXICET) 5-325 MG per tablet 2 tablet (has no administration in time range)  ondansetron (ZOFRAN-ODT) disintegrating tablet 4 mg (4 mg Oral Given 07/11/23 1930)  oxyCODONE-acetaminophen (PERCOCET/ROXICET) 5-325 MG per tablet 1 tablet (1 tablet Oral Given 07/11/23 1930)     ED Course/ Medical Decision Making/ A&P                                 Medical Decision Making Amount and/or Complexity of Data Reviewed Labs: ordered. Radiology: ordered and independent interpretation performed. ECG/medicine tests: ordered and independent interpretation performed.  Risk Prescription drug management.   She 50-year-old female here with sudden onset of right flank pain this afternoon while driving home from work.  Longstanding history of kidney stones and reports this feels similar.  By time of my evaluation she is currently pain-free and is in no acute distress.  Labs as above--no leukocytosis or electrolyte derangement.  UA with blood but no acute signs of infection.  CT with 3 mm right ureteral calculus.  Patient very comfortable with discharge home and symptomatic management.  She was given urine strainer.  She can follow-up with urology if any ongoing issues.  Return here for new concerns.  Final Clinical Impression(s) /  ED Diagnoses Final diagnoses:  Left ureteral stone    Rx / DC Orders ED Discharge Orders          Ordered    oxyCODONE-acetaminophen (PERCOCET) 5-325 MG tablet  Every 4 hours PRN        07/11/23 2315    ondansetron (ZOFRAN-ODT) 4 MG disintegrating tablet  Every 8 hours PRN        07/11/23 2315    tamsulosin (FLOMAX) 0.4 MG CAPS capsule  Daily after supper        07/11/23 2315              Garlon Hatchet, PA-C 07/11/23 2326    Glyn Ade, MD 07/12/23 0022

## 2023-08-17 ENCOUNTER — Other Ambulatory Visit: Payer: Self-pay | Admitting: Obstetrics and Gynecology

## 2023-08-17 DIAGNOSIS — Z1231 Encounter for screening mammogram for malignant neoplasm of breast: Secondary | ICD-10-CM

## 2023-09-29 ENCOUNTER — Ambulatory Visit
Admission: RE | Admit: 2023-09-29 | Discharge: 2023-09-29 | Disposition: A | Discharge status: Home / Self Care | Payer: 59 | Source: Ambulatory Visit | Attending: Obstetrics and Gynecology | Admitting: Obstetrics and Gynecology

## 2023-09-29 DIAGNOSIS — Z1231 Encounter for screening mammogram for malignant neoplasm of breast: Secondary | ICD-10-CM
